# Patient Record
Sex: Female | Born: 1935
Health system: Southern US, Community
[De-identification: ages and names within clinical notes are randomized; demographics above are authoritative.]

## PROBLEM LIST (undated history)

## (undated) DIAGNOSIS — R911 Solitary pulmonary nodule: Secondary | ICD-10-CM

## (undated) DIAGNOSIS — G250 Essential tremor: Secondary | ICD-10-CM

## (undated) DIAGNOSIS — Z8679 Personal history of other diseases of the circulatory system: Secondary | ICD-10-CM

## (undated) DIAGNOSIS — D7589 Other specified diseases of blood and blood-forming organs: Secondary | ICD-10-CM

## (undated) DIAGNOSIS — N183 Chronic kidney disease, stage 3 unspecified: Secondary | ICD-10-CM

## (undated) DIAGNOSIS — I472 Ventricular tachycardia: Secondary | ICD-10-CM

## (undated) DIAGNOSIS — I728 Aneurysm of other specified arteries: Secondary | ICD-10-CM

## (undated) DIAGNOSIS — R42 Dizziness and giddiness: Secondary | ICD-10-CM

## (undated) DIAGNOSIS — A389 Scarlet fever, uncomplicated: Secondary | ICD-10-CM

## (undated) DIAGNOSIS — R002 Palpitations: Secondary | ICD-10-CM

## (undated) DIAGNOSIS — Z8659 Personal history of other mental and behavioral disorders: Secondary | ICD-10-CM

## (undated) DIAGNOSIS — F419 Anxiety disorder, unspecified: Secondary | ICD-10-CM

## (undated) DIAGNOSIS — Z780 Asymptomatic menopausal state: Secondary | ICD-10-CM

## (undated) DIAGNOSIS — R7401 Elevation of levels of liver transaminase levels: Secondary | ICD-10-CM

## (undated) DIAGNOSIS — C439 Malignant melanoma of skin, unspecified: Secondary | ICD-10-CM

## (undated) DIAGNOSIS — L989 Disorder of the skin and subcutaneous tissue, unspecified: Secondary | ICD-10-CM

## (undated) DIAGNOSIS — N952 Postmenopausal atrophic vaginitis: Secondary | ICD-10-CM

## (undated) DIAGNOSIS — R55 Syncope and collapse: Secondary | ICD-10-CM

## (undated) DIAGNOSIS — I4729 Other ventricular tachycardia: Secondary | ICD-10-CM

## (undated) DIAGNOSIS — I10 Essential (primary) hypertension: Secondary | ICD-10-CM

## (undated) DIAGNOSIS — G5710 Meralgia paresthetica, unspecified lower limb: Secondary | ICD-10-CM

## (undated) HISTORY — DX: Syncope and collapse: R55

## (undated) HISTORY — DX: Personal history of other mental and behavioral disorders: Z86.59

## (undated) HISTORY — DX: Scarlet fever, uncomplicated: A38.9

## (undated) HISTORY — DX: Meralgia paresthetica, unspecified lower limb: G57.10

## (undated) HISTORY — DX: Disorder of the skin and subcutaneous tissue, unspecified: L98.9

## (undated) HISTORY — DX: Asymptomatic menopausal state: Z78.0

## (undated) HISTORY — PX: DENTAL SURGERY: SHX609

## (undated) HISTORY — DX: Ventricular tachycardia: I47.2

## (undated) HISTORY — PX: CATARACT EXTRACTION: SUR2

## (undated) HISTORY — DX: Other ventricular tachycardia: I47.29

## (undated) HISTORY — PX: TONSILLECTOMY: SUR1361

## (undated) HISTORY — DX: Personal history of other diseases of the circulatory system: Z86.79

## (undated) HISTORY — DX: Aneurysm of other specified arteries: I72.8

## (undated) HISTORY — DX: Essential (primary) hypertension: I10

## (undated) HISTORY — DX: Palpitations: R00.2

## (undated) HISTORY — DX: Chronic kidney disease, stage 3 unspecified: N18.30

## (undated) HISTORY — DX: Other specified diseases of blood and blood-forming organs: D75.89

## (undated) HISTORY — DX: Elevation of levels of liver transaminase levels: R74.01

## (undated) HISTORY — DX: Postmenopausal atrophic vaginitis: N95.2

## (undated) HISTORY — DX: Solitary pulmonary nodule: R91.1

## (undated) HISTORY — PX: OTHER SURGICAL HISTORY: SHX169

## (undated) HISTORY — PX: SKIN CANCER EXCISION: SHX779

## (undated) HISTORY — DX: Essential tremor: G25.0

## (undated) HISTORY — DX: Dizziness and giddiness: R42

## (undated) HISTORY — DX: Anxiety disorder, unspecified: F41.9

## (undated) HISTORY — DX: Malignant melanoma of skin, unspecified: C43.9

---

## 1998-05-29 ENCOUNTER — Other Ambulatory Visit: Admission: RE | Admit: 1998-05-29 | Discharge: 1998-05-29 | Payer: Self-pay | Admitting: Obstetrics and Gynecology

## 1999-05-31 ENCOUNTER — Other Ambulatory Visit: Admission: RE | Admit: 1999-05-31 | Discharge: 1999-05-31 | Payer: Self-pay | Admitting: Obstetrics and Gynecology

## 1999-12-30 ENCOUNTER — Encounter (INDEPENDENT_AMBULATORY_CARE_PROVIDER_SITE_OTHER): Payer: Self-pay | Admitting: Specialist

## 1999-12-30 ENCOUNTER — Ambulatory Visit (HOSPITAL_COMMUNITY): Admission: RE | Admit: 1999-12-30 | Discharge: 1999-12-30 | Payer: Self-pay | Admitting: Gastroenterology

## 2000-07-27 ENCOUNTER — Other Ambulatory Visit: Admission: RE | Admit: 2000-07-27 | Discharge: 2000-07-27 | Payer: Self-pay | Admitting: Obstetrics and Gynecology

## 2000-08-23 ENCOUNTER — Encounter: Admission: RE | Admit: 2000-08-23 | Discharge: 2000-08-23 | Payer: Self-pay | Admitting: Obstetrics and Gynecology

## 2000-08-23 ENCOUNTER — Encounter: Payer: Self-pay | Admitting: Obstetrics and Gynecology

## 2001-03-23 ENCOUNTER — Encounter: Payer: Self-pay | Admitting: *Deleted

## 2001-03-23 ENCOUNTER — Ambulatory Visit (HOSPITAL_COMMUNITY): Admission: RE | Admit: 2001-03-23 | Discharge: 2001-03-23 | Payer: Self-pay | Admitting: *Deleted

## 2001-08-06 ENCOUNTER — Other Ambulatory Visit: Admission: RE | Admit: 2001-08-06 | Discharge: 2001-08-06 | Payer: Self-pay | Admitting: Obstetrics and Gynecology

## 2002-08-26 ENCOUNTER — Encounter: Admission: RE | Admit: 2002-08-26 | Discharge: 2002-08-26 | Payer: Self-pay | Admitting: Internal Medicine

## 2002-08-26 ENCOUNTER — Encounter: Payer: Self-pay | Admitting: Internal Medicine

## 2002-09-19 ENCOUNTER — Other Ambulatory Visit: Admission: RE | Admit: 2002-09-19 | Discharge: 2002-09-19 | Payer: Self-pay | Admitting: Obstetrics and Gynecology

## 2003-11-28 ENCOUNTER — Other Ambulatory Visit: Admission: RE | Admit: 2003-11-28 | Discharge: 2003-11-28 | Payer: Self-pay | Admitting: Obstetrics and Gynecology

## 2003-12-03 ENCOUNTER — Ambulatory Visit (HOSPITAL_COMMUNITY): Admission: RE | Admit: 2003-12-03 | Discharge: 2003-12-03 | Payer: Self-pay | Admitting: Internal Medicine

## 2005-02-17 ENCOUNTER — Ambulatory Visit (HOSPITAL_COMMUNITY): Admission: RE | Admit: 2005-02-17 | Discharge: 2005-02-17 | Payer: Self-pay | Admitting: Gastroenterology

## 2006-01-03 ENCOUNTER — Ambulatory Visit (HOSPITAL_COMMUNITY): Admission: RE | Admit: 2006-01-03 | Discharge: 2006-01-03 | Payer: Self-pay | Admitting: Internal Medicine

## 2006-03-06 ENCOUNTER — Other Ambulatory Visit: Admission: RE | Admit: 2006-03-06 | Discharge: 2006-03-06 | Payer: Self-pay | Admitting: Obstetrics & Gynecology

## 2007-03-09 ENCOUNTER — Other Ambulatory Visit: Admission: RE | Admit: 2007-03-09 | Discharge: 2007-03-09 | Payer: Self-pay | Admitting: Obstetrics & Gynecology

## 2008-09-23 ENCOUNTER — Ambulatory Visit (HOSPITAL_COMMUNITY): Admission: RE | Admit: 2008-09-23 | Discharge: 2008-09-23 | Payer: Self-pay | Admitting: Obstetrics and Gynecology

## 2009-03-09 ENCOUNTER — Other Ambulatory Visit: Admission: RE | Admit: 2009-03-09 | Discharge: 2009-03-09 | Payer: Self-pay | Admitting: Obstetrics & Gynecology

## 2011-03-08 ENCOUNTER — Other Ambulatory Visit: Payer: Self-pay | Admitting: Internal Medicine

## 2011-03-08 DIAGNOSIS — R911 Solitary pulmonary nodule: Secondary | ICD-10-CM

## 2011-03-11 ENCOUNTER — Other Ambulatory Visit: Payer: Self-pay | Admitting: Internal Medicine

## 2011-03-11 DIAGNOSIS — N281 Cyst of kidney, acquired: Secondary | ICD-10-CM

## 2011-03-11 DIAGNOSIS — R911 Solitary pulmonary nodule: Secondary | ICD-10-CM

## 2011-03-21 ENCOUNTER — Ambulatory Visit
Admission: RE | Admit: 2011-03-21 | Discharge: 2011-03-21 | Disposition: A | Discharge status: Home / Self Care | Payer: Medicare Other | Source: Ambulatory Visit | Attending: Internal Medicine | Admitting: Internal Medicine

## 2011-03-21 DIAGNOSIS — R911 Solitary pulmonary nodule: Secondary | ICD-10-CM

## 2011-03-21 DIAGNOSIS — N281 Cyst of kidney, acquired: Secondary | ICD-10-CM

## 2011-04-01 NOTE — Op Note (Signed)
NAMENETTYE, FLEGAL            ACCOUNT NO.:  [id number]   MEDICAL RECORD NO.:  [id number]          PATIENT TYPE:  AMB   LOCATION:  ENDO                         FACILITY:  [facility]   PHYSICIAN:  Petra Kuba, M.D.    DATE OF BIRTH:  07-04-1929   DATE OF PROCEDURE:  02/17/2005  DATE OF DISCHARGE:                                 OPERATIVE REPORT   PROCEDURE:  Colonoscopy.   INDICATIONS:  Personal history of colon polyps.  Family history of colon  cancer.   Consent was signed after risks, benefits, methods, and options thoroughly  discussed in the office multiple times in the past.   MEDICINES USED:  Demerol 40, Versed 5.   PROCEDURE:  Rectal inspection is pertinent for external hemorrhoids, small.  Digital exam was negative.  The video pediatric adjustable colonoscope was  inserted, despite a tortuous colon with lots of bubbles.  With rolling her  on her back and various abdominal pressures, we were able to be advanced to  the cecum.  Other than some left-sided occasional diverticula, no  abnormalities were seen on insertion.  The cecum was identified by the  appendiceal orifice and the ileocecal valve.  The scope was slowly  withdrawn.  It took a liter of __________ washing for adequate visualization  but on slow withdrawal back to the rectum, other than an occasional left-  sided diverticula, no polyps, tumors, or masses were seen.  Anorectal pull-  through and retroflexion confirmed some small hemorrhoids.  The scope was  straightened and readvanced shortways up the left side of the colon.  Air  was suctioned.  The scope removed.  The patient tolerated the procedure  well.  There was no obvious immediate complication.   ENDOSCOPIC DIAGNOSES:  1.  Internal/external hemorrhoids.  2.  Left-sided occasional diverticula.  3.  Otherwise within normal limits to the cecum.   PLAN:  Yearly rectals and guaiacs per either Dr. Renne Crigler or Dr. Myrlene Broker.  Happy to see back p.r.n.   Otherwise if doing well in five years, consider  repeat screening, which might include a virtual colonoscopy if widely  acceptable, covered by insurance, etc.      MEM/MEDQ  D:  02/17/2005  T:  02/17/2005  Job:  [id number]   cc:   Laqueta Linden, M.D.  [address]  [place]  [place] [address]  Fax: [phone]   Soyla Murphy. Renne Crigler, M.D.  [address]  [place]  [place] [address]  Fax: [phone]

## 2011-05-10 ENCOUNTER — Other Ambulatory Visit: Payer: Self-pay | Admitting: Specialist

## 2011-05-10 DIAGNOSIS — I739 Peripheral vascular disease, unspecified: Secondary | ICD-10-CM

## 2011-05-12 ENCOUNTER — Ambulatory Visit
Admission: RE | Admit: 2011-05-12 | Discharge: 2011-05-12 | Disposition: A | Discharge status: Home / Self Care | Payer: Medicare Other | Source: Ambulatory Visit | Attending: Specialist | Admitting: Specialist

## 2011-05-12 DIAGNOSIS — I739 Peripheral vascular disease, unspecified: Secondary | ICD-10-CM

## 2012-03-26 ENCOUNTER — Other Ambulatory Visit: Payer: Self-pay | Admitting: Nurse Practitioner

## 2012-03-26 DIAGNOSIS — M810 Age-related osteoporosis without current pathological fracture: Secondary | ICD-10-CM

## 2012-03-26 DIAGNOSIS — Z1231 Encounter for screening mammogram for malignant neoplasm of breast: Secondary | ICD-10-CM

## 2012-04-12 ENCOUNTER — Other Ambulatory Visit: Payer: Self-pay | Admitting: Internal Medicine

## 2012-04-12 DIAGNOSIS — R222 Localized swelling, mass and lump, trunk: Secondary | ICD-10-CM

## 2012-04-20 ENCOUNTER — Ambulatory Visit
Admission: RE | Admit: 2012-04-20 | Discharge: 2012-04-20 | Disposition: A | Discharge status: Home / Self Care | Payer: Medicare Other | Source: Ambulatory Visit | Attending: Internal Medicine | Admitting: Internal Medicine

## 2012-04-20 DIAGNOSIS — R222 Localized swelling, mass and lump, trunk: Secondary | ICD-10-CM

## 2012-04-23 ENCOUNTER — Ambulatory Visit (HOSPITAL_COMMUNITY): Payer: Medicare Other | Attending: Nurse Practitioner

## 2012-04-23 ENCOUNTER — Ambulatory Visit (HOSPITAL_COMMUNITY): Payer: Medicare Other

## 2013-03-28 ENCOUNTER — Encounter: Payer: Self-pay | Admitting: *Deleted

## 2013-03-29 ENCOUNTER — Encounter: Payer: Self-pay | Admitting: Nurse Practitioner

## 2013-03-29 ENCOUNTER — Ambulatory Visit (INDEPENDENT_AMBULATORY_CARE_PROVIDER_SITE_OTHER): Payer: MEDICARE | Admitting: Nurse Practitioner

## 2013-03-29 VITALS — BP 122/64 | Ht 62.25 in | Wt 122.0 lb

## 2013-03-29 DIAGNOSIS — L940 Localized scleroderma [morphea]: Secondary | ICD-10-CM

## 2013-03-29 DIAGNOSIS — N952 Postmenopausal atrophic vaginitis: Secondary | ICD-10-CM

## 2013-03-29 DIAGNOSIS — Z01419 Encounter for gynecological examination (general) (routine) without abnormal findings: Secondary | ICD-10-CM

## 2013-03-29 DIAGNOSIS — N904 Leukoplakia of vulva: Secondary | ICD-10-CM

## 2013-03-29 MED ORDER — CLOBETASOL PROPIONATE 0.05 % EX CREA: TOPICAL_CREAM | CUTANEOUS | Status: DC | PRN

## 2013-03-29 MED ORDER — ESTRADIOL 0.1 MG/GM VA CREA: 1.0000 g | TOPICAL_CREAM | VAGINAL | Status: DC

## 2013-03-29 NOTE — Patient Instructions (Signed)

## 2013-03-29 NOTE — Progress Notes (Signed)
79 y.o. G59P4 Married Caucasian Fe here for annual exam.  Feels well. Needs refill on estrace cream and clobetasol for LSA. Only occ flares  Husband with several falls and will be having home PT in the near future.   The wedding shower for her daughter who lives in [place]  last July was a big hit.  Patient's last menstrual period was 11/14/1978.          Sexually active: no  The current method of family planning is abstinence.    Exercising: yes  walking, gardening & swimming Smoker:  no  Health Maintenance: Pap:  03-23-11 neg MMG:  09-23-08 will schedule Colonoscopy:  01-2011=  3 polyps recheck in 5 years TDaP:  1/05 Labs: done by Dr. Renne Crigler   reports that she has never smoked. She does not have any smokeless tobacco history on file. She reports that she does not drink alcohol or use illicit drugs.  Past Medical History  Diagnosis Date  . Syncope   . Palpitations     occasional  . Dizziness   . Meralgia paresthetica     History of  . History of anxiety   . Tinnitus     Chronic  . History of rheumatic fever   . Macrocytosis     History of chronic macrocytosis  . Allergic rhinitis   . Osteoporosis   . Post-menopausal   . Sclerosis of the skin     lichen  . Atrophic vaginitis     Past Surgical History  Procedure Laterality Date  . Colon polyps      Current Outpatient Prescriptions  Medication Sig Dispense Refill  . Cholecalciferol (VITAMIN D) 2000 UNITS CAPS Take by mouth daily.      . clobetasol cream (TEMOVATE) 0.05 % Apply topically as needed.       . Coenzyme Q10 (CO Q 10 PO) Take by mouth every other day.      . estradiol (ESTRACE) 0.1 MG/GM vaginal cream Place vaginally 2 (two) times a week.       . Flaxseed, Linseed, (FLAXSEED OIL) 1000 MG CAPS Take by mouth daily.      . magnesium 30 MG tablet Take 30 mg by mouth daily.      . Multiple Vitamins-Minerals (OCUVITE PO) Take by mouth daily.      . Nutritional Supplements (JUICE PLUS FIBRE PO) Take by mouth daily.       . Omega-3 Fatty Acids (FISH OIL PO) Take by mouth 3 (three) times a week.       No current facility-administered medications for this visit.    Family History  Problem Relation Age of Onset  . Heart attack Father     ROS:  Pertinent items are noted in HPI.  Otherwise, a comprehensive ROS was negative.  Exam:   BP 122/64  Ht 5' 2.25" (1.581 m)  Wt 122 lb (55.339 kg)  BMI 22.14 kg/m2  LMP 11/14/1978 Height: 5' 2.25" (158.1 cm)  Ht Readings from Last 3 Encounters:  03/29/13 5' 2.25" (1.581 m)    General appearance: alert, cooperative and appears stated age Head: Normocephalic, without obvious abnormality, atraumatic Neck: no adenopathy, supple, symmetrical, trachea midline and thyroid normal to inspection and palpation Lungs: clear to auscultation bilaterally Breasts: normal appearance, no masses or tenderness Heart: regular rate and rhythm Abdomen: soft, non-tender; no masses,  no organomegaly Extremities: extremities normal, atraumatic, no cyanosis or edema Skin: Skin color, texture, turgor normal. No rashes or lesions Lymph nodes: Cervical,  supraclavicular, and axillary nodes normal. No abnormal inguinal nodes palpated Neurologic: Grossly normal   Pelvic: External genitalia:  no flare of LSA lesions              Urethra:  atrophic appearing urethra with no masses, tenderness or lesions              Bartholin's and Skene's: normal                 Vagina: atrophic appearing vagina with pale color and no discharge, no lesions              Cervix: anteverted              Pap taken: no Bimanual Exam:  Uterus:  normal size, contour, position, consistency, mobility, non-tender              Adnexa: no mass, fullness, tenderness               Rectovaginal: Confirms               Anus:  normal sphincter tone, no lesions  A:  Well Woman with normal exam  Postmenopausal   Atrophic vaginitis  History of LSA  History of colon polyps - declines OC light cards here took test with  PCP  Osteopenia managed by PCP    P:   Pap smear as per guidelines   Mammogram - patient will schedule  Refill on Estrace and clobetasol  Caution with outdoor activities and balance issues    return annually or prn  An After Visit Summary was printed and given to the patient.

## 2013-04-03 NOTE — Progress Notes (Signed)
Encounter reviewed by Dr. Brook Silva.  

## 2013-10-17 ENCOUNTER — Telehealth: Payer: Self-pay | Admitting: Nurse Practitioner

## 2013-10-17 NOTE — Telephone Encounter (Signed)
Patient has a hx of osteopenia which is handled by pcp per last note.  Message left to return call to [place] at [phone].

## 2013-10-17 NOTE — Telephone Encounter (Signed)
Patient had a recent bone density done at [facility]. Patient has some questions about medications. Patient asked to speak with Shirlyn Goltz, NP directly but is aware a triage nurse will call her first.

## 2013-10-17 NOTE — Telephone Encounter (Signed)
Patient returned call, states that [facility] was to send over Dexa. That is what patient would like to to talk to Lauro Franklin, FNP about. Advised that I could not tell her when Clayborne Dana would be able to call but that I would send her the message.

## 2013-10-22 NOTE — Telephone Encounter (Signed)
Called [facility] states she is not a patient there) and they states they will have nurse call me back for request for Dexa.

## 2013-10-22 NOTE — Telephone Encounter (Signed)
DEXA and chart to your desk. Thanks, Patty!

## 2013-10-22 NOTE — Telephone Encounter (Signed)
Patient was called and reviewed BMD results from [facility].  After looking at [facility] from [facility] and comparison there are some changes that may be related to different machines.  Her spine has not been used for comparison at [facility] secondary to scoliosis.  At this last BMD her T score falls in the Osteoporosis range at the hip sites.  She has been recommended to start on Fosamax but is reluctant due to potential GI side effects.  She would rather wait and increase exercise and calcium with Vit D.  She is also seeing a chiropractor for her back.  She prefers to not take med's.  This of course is her choice.  She will pick up a copy of BMD tomorrow to review with chiropractor.

## 2014-04-03 ENCOUNTER — Encounter: Payer: Self-pay | Admitting: Nurse Practitioner

## 2014-04-03 ENCOUNTER — Ambulatory Visit (INDEPENDENT_AMBULATORY_CARE_PROVIDER_SITE_OTHER): Payer: Medicare HMO | Admitting: Nurse Practitioner

## 2014-04-03 VITALS — BP 132/84 | HR 80 | Ht 63.0 in | Wt 129.0 lb

## 2014-04-03 DIAGNOSIS — L940 Localized scleroderma [morphea]: Secondary | ICD-10-CM

## 2014-04-03 DIAGNOSIS — N952 Postmenopausal atrophic vaginitis: Secondary | ICD-10-CM

## 2014-04-03 DIAGNOSIS — Z01419 Encounter for gynecological examination (general) (routine) without abnormal findings: Secondary | ICD-10-CM

## 2014-04-03 DIAGNOSIS — Z0000 Encounter for general adult medical examination without abnormal findings: Secondary | ICD-10-CM

## 2014-04-03 DIAGNOSIS — N904 Leukoplakia of vulva: Secondary | ICD-10-CM

## 2014-04-03 LAB — POCT URINALYSIS DIPSTICK
Bilirubin, UA: NEGATIVE
Blood, UA: NEGATIVE
Glucose, UA: NEGATIVE
Ketones, UA: NEGATIVE
Leukocytes, UA: NEGATIVE
Nitrite, UA: NEGATIVE
Protein, UA: NEGATIVE
Urobilinogen, UA: NEGATIVE
pH, UA: 7

## 2014-04-03 MED ORDER — CLOBETASOL PROPIONATE 0.05 % EX CREA: TOPICAL_CREAM | CUTANEOUS | Status: DC | PRN

## 2014-04-03 MED ORDER — ESTRADIOL 0.1 MG/GM VA CREA: 1.0000 g | TOPICAL_CREAM | VAGINAL | Status: DC

## 2014-04-03 NOTE — Progress Notes (Signed)
Patient ID: Katie Rubio, female   DOB: 1929-02-13, 80 y.o.   MRN: [id number] 80 y.o. G79P3 Married Caucasian Fe here for annual exam.  daughter is doing well.  Husband still with some ortho problems and had to do PT.  He has now stopped drinking.  She does have muscle spasm in both legs at night occasionally.  Thinks she has current flare of LSA - some discomfort after BM and wiping at the peri-anal area.  Rare use of Clobetasol.  Rare use on Estrace vaginal cream.  Patient's last menstrual period was 11/14/1978.          Sexually active: no  The current method of family planning is abstinence and post menopausal status.    Exercising: yes  pt walks at home and gardens Smoker:  no  Health Maintenance: Pap: 03-23-11 neg  MMG: 09-23-08 will schedule  Colonoscopy: 01-2011= 3 polyps recheck in 5 years  TDaP: 1/05  Labs:  Dr. Shelia Media  Urine:  Negative     reports that she has never smoked. She does not have any smokeless tobacco history on file. She reports that she does not drink alcohol or use illicit drugs.  Past Medical History  Diagnosis Date  . Syncope   . Palpitations     occasional  . Dizziness   . Meralgia paresthetica     History of  . History of anxiety   . Tinnitus     Chronic  . History of rheumatic fever   . Macrocytosis     History of chronic macrocytosis  . Allergic rhinitis   . Osteoporosis   . Post-menopausal   . Sclerosis of the skin     lichen  . Atrophic vaginitis     Past Surgical History  Procedure Laterality Date  . Colon polyps      Current Outpatient Prescriptions  Medication Sig Dispense Refill  . Cholecalciferol (VITAMIN D) 2000 UNITS CAPS Take by mouth daily.      . clobetasol cream (TEMOVATE) 0.05 % Apply topically as needed.  30 g  3  . Coenzyme Q10 (CO Q 10 PO) Take by mouth every other day.      . estradiol (ESTRACE) 0.1 MG/GM vaginal cream Place 1.06 Applicatorfuls vaginally 2 (two) times a week.  42.5 g  3  . Flaxseed, Linseed,  (FLAXSEED OIL) 1000 MG CAPS Take by mouth daily.      . magnesium 30 MG tablet Take 30 mg by mouth daily.      . Multiple Vitamins-Minerals (OCUVITE PO) Take by mouth daily.      . Nutritional Supplements (JUICE PLUS FIBRE PO) Take by mouth daily.      . Omega-3 Fatty Acids (FISH OIL PO) Take by mouth 3 (three) times a week.       No current facility-administered medications for this visit.    Family History  Problem Relation Age of Onset  . Heart attack Father   . Cancer - Colon Mother     colon    ROS:  Pertinent items are noted in HPI.  Otherwise, a comprehensive ROS was negative.  Exam:   BP 132/84  Pulse 80  Ht 5\' 3"  (1.6 m)  Wt 129 lb (58.514 kg)  BMI 22.86 kg/m2  LMP 11/14/1978 Height: 5\' 3"  (160 cm)  Ht Readings from Last 3 Encounters:  04/03/14 5\' 3"  (1.6 m)  03/29/13 5' 2.25" (1.581 m)    General appearance: alert, cooperative and appears stated age Head: Normocephalic,  without obvious abnormality, atraumatic Neck: no adenopathy, supple, symmetrical, trachea midline and thyroid normal to inspection and palpation Lungs: clear to auscultation bilaterally Breasts: normal appearance, no masses or tenderness Heart: regular rate and rhythm Abdomen: soft, non-tender; no masses,  no organomegaly Extremities: extremities normal, atraumatic, no cyanosis or edema Skin: Skin color, texture, turgor normal. No rashes or lesions Lymph nodes: Cervical, supraclavicular, and axillary nodes normal. No abnormal inguinal nodes palpated Neurologic: Grossly normal   Pelvic: External genitalia:  Lesions consistent with LSA              Urethra:  normal appearing urethra with no masses, tenderness or lesions              Bartholin's and Skene's: normal                 Vagina: normal appearing vagina with normal color and discharge, no lesions              Cervix: anteverted              Pap taken: no Bimanual Exam:  Uterus:  normal size, contour, position, consistency, mobility,  non-tender              Adnexa: no mass, fullness, tenderness               Rectovaginal: Confirms               Anus:  normal sphincter tone, no lesions  A:  Well Woman with normal exam  Postmenopausal  History of LSA with flare  P:   Reviewed health and wellness pertinent to exam  Pap smear taken today  Mammogram is due now and we will schedule for her  Refill on Clobetasol and will use now BID for a week or so to calm current flare  Refill on Estrace to use pea size amount to introitus prn, cautioned with risk of DVT, CVA, etc.  Counseled on breast self exam, mammography screening, use and side effects of HRT, adequate intake of calcium and vitamin D, diet and exercise return annually or prn  An After Visit Summary was printed and given to the patient.

## 2014-04-03 NOTE — Patient Instructions (Signed)

## 2014-04-11 NOTE — Progress Notes (Signed)
Encounter reviewed by Dr. Ahsha Hinsley Silva.  

## 2014-09-15 ENCOUNTER — Encounter: Payer: Self-pay | Admitting: Nurse Practitioner

## 2015-04-06 ENCOUNTER — Ambulatory Visit (INDEPENDENT_AMBULATORY_CARE_PROVIDER_SITE_OTHER): Payer: Medicare HMO | Admitting: Nurse Practitioner

## 2015-04-06 ENCOUNTER — Encounter: Payer: Self-pay | Admitting: Nurse Practitioner

## 2015-04-06 VITALS — BP 122/80 | HR 72 | Ht 63.0 in | Wt 131.0 lb

## 2015-04-06 DIAGNOSIS — Z0000 Encounter for general adult medical examination without abnormal findings: Secondary | ICD-10-CM

## 2015-04-06 DIAGNOSIS — N952 Postmenopausal atrophic vaginitis: Secondary | ICD-10-CM

## 2015-04-06 DIAGNOSIS — E559 Vitamin D deficiency, unspecified: Secondary | ICD-10-CM | POA: Diagnosis not present

## 2015-04-06 DIAGNOSIS — N904 Leukoplakia of vulva: Secondary | ICD-10-CM

## 2015-04-06 DIAGNOSIS — Z01419 Encounter for gynecological examination (general) (routine) without abnormal findings: Secondary | ICD-10-CM

## 2015-04-06 LAB — POCT URINALYSIS DIPSTICK
Bilirubin, UA: NEGATIVE
Blood, UA: NEGATIVE
GLUCOSE UA: NEGATIVE
Ketones, UA: NEGATIVE
LEUKOCYTES UA: NEGATIVE
NITRITE UA: NEGATIVE
PH UA: 5
Protein, UA: NEGATIVE
UROBILINOGEN UA: NEGATIVE

## 2015-04-06 MED ORDER — CLOBETASOL PROPIONATE 0.05 % EX CREA: TOPICAL_CREAM | CUTANEOUS | Status: DC | PRN

## 2015-04-06 MED ORDER — ESTRADIOL 0.1 MG/GM VA CREA: 1.0000 g | TOPICAL_CREAM | VAGINAL | Status: DC

## 2015-04-06 NOTE — Patient Instructions (Signed)

## 2015-04-06 NOTE — Progress Notes (Signed)
Patient ID: Katie Rubio, female   DOB: February 15, 1929, 81 y.o.   MRN: [id number] 81 y.o. G51P3 Married  Caucasian Fe here for annual exam.  New grandson 50 months old.  She has a list of other medical problems related to back knees, husbands back problems.  Just need to talk about al this.  She continues to work outside if anything needs to be done since husband not able to do anything.  Patient's last menstrual period was 11/14/1978.  Pt is postmenopausal.      Sexually active: No.  The current method of family planning is none.    Exercising: Yes.    walking, gardening and pool during the summer Smoker:  no  Health Maintenance: Pap:  03/23/11, negative MMG:  09/24/08, Bi-Rads 1:  Negative Colonoscopy:  01/2011, 3 polyps, recall in 5 years BMD:   09/17/13, T Score -1.3 S/-2.5 R/-2.8 L TDaP:  11/2003 Labs:  Dr. Shelia Media 09/2014  Urine:  negative      reports that she has never smoked. She does not have any smokeless tobacco history on file. She reports that she does not drink alcohol or use illicit drugs.  Past Medical History  Diagnosis Date  . Syncope   . Palpitations     occasional  . Dizziness   . Meralgia paresthetica     History of  . History of anxiety   . Tinnitus     Chronic  . History of rheumatic fever   . Macrocytosis     History of chronic macrocytosis  . Allergic rhinitis   . Osteoporosis   . Post-menopausal   . Sclerosis of the skin     lichen  . Atrophic vaginitis     Past Surgical History  Procedure Laterality Date  . Colon polyps      Current Outpatient Prescriptions  Medication Sig Dispense Refill  . Cholecalciferol (VITAMIN D) 2000 UNITS CAPS Take by mouth daily.    . clobetasol cream (TEMOVATE) 0.05 % Apply topically as needed. 30 g 3  . Coenzyme Q10 (CO Q 10 PO) Take by mouth every other day.    . estradiol (ESTRACE) 0.1 MG/GM vaginal cream Place 6.25 Applicatorfuls vaginally 2 (two) times a week. 42.5 g 3  . Flaxseed, Linseed, (FLAXSEED OIL) 1000 MG  CAPS Take by mouth daily.    . magnesium 30 MG tablet Take 30 mg by mouth daily.    . Multiple Vitamins-Minerals (OCUVITE PO) Take by mouth daily.    . Nutritional Supplements (JUICE PLUS FIBRE PO) Take by mouth daily.    . Omega-3 Fatty Acids (FISH OIL PO) Take by mouth 3 (three) times a week.     No current facility-administered medications for this visit.    Family History  Problem Relation Age of Onset  . Heart attack Father   . Cancer - Colon Mother     colon    ROS:  Pertinent items are noted in HPI.  Otherwise, a comprehensive ROS was negative.  Exam:   BP 122/80 mmHg  Pulse 72  Ht 5\' 3"  (1.6 m)  Wt 131 lb (59.421 kg)  BMI 23.21 kg/m2  LMP 11/14/1978 Height: 5\' 3"  (160 cm) Ht Readings from Last 3 Encounters:  04/06/15 5\' 3"  (1.6 m)  04/03/14 5\' 3"  (1.6 m)  03/29/13 5' 2.25" (1.581 m)    General appearance: alert, cooperative and appears stated age Head: Normocephalic, without obvious abnormality, atraumatic Neck: no adenopathy, supple, symmetrical, trachea midline and thyroid normal to  inspection and palpation Lungs: clear to auscultation bilaterally Breasts: normal appearance, no masses or tenderness Heart: regular rate and rhythm Abdomen: soft, non-tender; no masses,  no organomegaly Extremities: extremities normal, atraumatic, no cyanosis or edema Skin: Skin color, texture, turgor normal. No rashes or lesions Lymph nodes: Cervical, supraclavicular, and axillary nodes normal. No abnormal inguinal nodes palpated Neurologic: Grossly normal   Pelvic: External genitalia:  Lesions of LSA on vulva              Urethra:  normal appearing urethra with no masses, tenderness or lesions              Bartholin's and Skene's: normal                 Vagina: very atrophic appearing vagina with normal color and discharge, no lesions              Cervix: anteverted              Pap taken: Yes.   Bimanual Exam:  Uterus:  normal size, contour, position, consistency, mobility,  non-tender              Adnexa: no mass, fullness, tenderness               Rectovaginal: Confirms               Anus:  normal sphincter tone, no lesions  Chaperone present:  yes   A:  Well Woman with normal exam  Postmenopausal History of LSA without current flare  Atrophic vaginitis  History of Vit D deficeincy   P:   Reviewed health and wellness pertinent to exam  Pap smear as above  Mammogram is due but she declines another one  She is given refill on clobetasol and estrogen cream to use to treat LSA - she uses either of these very rarely  Counseled on DVT, CVA, cancer, etc  Check Vit D and follow  Counseled on breast self exam, mammography screening, adequate intake of calcium and vitamin D, diet and exercise, Kegel's exercises return annually or prn  An After Visit Summary was printed and given to the patient.

## 2015-04-07 LAB — VITAMIN D 25 HYDROXY (VIT D DEFICIENCY, FRACTURES): Vit D, 25-Hydroxy: 30 ng/mL (ref 30–100)

## 2015-04-08 LAB — IPS PAP SMEAR ONLY

## 2015-04-12 NOTE — Progress Notes (Signed)
Encounter reviewed by Dr. Brook Silva.  

## 2015-07-06 ENCOUNTER — Other Ambulatory Visit (INDEPENDENT_AMBULATORY_CARE_PROVIDER_SITE_OTHER): Payer: Medicare HMO

## 2015-07-06 ENCOUNTER — Telehealth: Payer: Self-pay

## 2015-07-06 ENCOUNTER — Other Ambulatory Visit: Payer: Medicare HMO

## 2015-07-06 DIAGNOSIS — E559 Vitamin D deficiency, unspecified: Secondary | ICD-10-CM

## 2015-07-06 DIAGNOSIS — N39498 Other specified urinary incontinence: Secondary | ICD-10-CM

## 2015-07-06 NOTE — Telephone Encounter (Signed)
Yes thank you very much for sending the referral.

## 2015-07-06 NOTE — Telephone Encounter (Signed)
Spoke with patient while in office this morning. Patient states that she has spoken with Milford Cage, FNP regarding "taking classes for not being able to hold my bladder at night to get to the bathroom." Patient is interested in pursuing this at this time. Advised referral will need to be placed for patient to be seen with pelvic PT by Milford Cage, FNP. Advised I will let Milford Cage, FNP know so this may be done. Advised the referral information and OV notes will be sent and appointment will be made. Advised she will hear from our referrals coordinator Becky or from [place] office directly to set up appointment. Patient is agreeable.  Milford Cage, FNP okay to send referral for pelvic PT to [facility] at this time?

## 2015-07-07 LAB — VITAMIN D 25 HYDROXY (VIT D DEFICIENCY, FRACTURES): VIT D 25 HYDROXY: 32 ng/mL (ref 30–100)

## 2015-07-07 NOTE — Telephone Encounter (Signed)
Referral placed in [facility] for patient to see [facility] for pelvic PT.   CC: Katie Rubio for referral coordination  Routing to provider for final review. Patient agreeable to disposition. Will close encounter.

## 2015-07-08 ENCOUNTER — Other Ambulatory Visit: Payer: Medicare HMO

## 2015-07-09 ENCOUNTER — Telehealth: Payer: Self-pay | Admitting: *Deleted

## 2015-07-09 NOTE — Telephone Encounter (Signed)
I have attempted to contact this patient by phone with the following results: message left to return my call with female at home number.  [phone] (Home)

## 2015-07-09 NOTE — Telephone Encounter (Signed)
-----   Message from Kem Boroughs, [place] sent at 07/07/2015  8:40 AM EDT ----- Let pt. Know that her Vit D is some better from 30 - 32.  She was on OTC Vit D 2000 IU daily and we added an extra dose every other day - that only bought her results up to 32.  Now lets have her to increase to 4000 IU daily especially during the winter months.  Then will recheck at [facility].

## 2015-07-09 NOTE — Telephone Encounter (Signed)
Pt notified in result note.  Closing encounter. 

## 2015-08-20 DIAGNOSIS — R351 Nocturia: Secondary | ICD-10-CM | POA: Diagnosis not present

## 2015-08-20 DIAGNOSIS — R32 Unspecified urinary incontinence: Secondary | ICD-10-CM | POA: Diagnosis not present

## 2015-08-20 DIAGNOSIS — R278 Other lack of coordination: Secondary | ICD-10-CM | POA: Diagnosis not present

## 2015-08-20 DIAGNOSIS — N3944 Nocturnal enuresis: Secondary | ICD-10-CM | POA: Diagnosis not present

## 2015-08-20 DIAGNOSIS — M6281 Muscle weakness (generalized): Secondary | ICD-10-CM | POA: Diagnosis not present

## 2015-09-03 DIAGNOSIS — M6281 Muscle weakness (generalized): Secondary | ICD-10-CM | POA: Diagnosis not present

## 2015-09-03 DIAGNOSIS — R32 Unspecified urinary incontinence: Secondary | ICD-10-CM | POA: Diagnosis not present

## 2015-09-03 DIAGNOSIS — N3944 Nocturnal enuresis: Secondary | ICD-10-CM | POA: Diagnosis not present

## 2015-09-03 DIAGNOSIS — R351 Nocturia: Secondary | ICD-10-CM | POA: Diagnosis not present

## 2015-09-03 DIAGNOSIS — R278 Other lack of coordination: Secondary | ICD-10-CM | POA: Diagnosis not present

## 2015-09-08 DIAGNOSIS — R32 Unspecified urinary incontinence: Secondary | ICD-10-CM | POA: Diagnosis not present

## 2015-09-08 DIAGNOSIS — M6281 Muscle weakness (generalized): Secondary | ICD-10-CM | POA: Diagnosis not present

## 2015-09-08 DIAGNOSIS — R278 Other lack of coordination: Secondary | ICD-10-CM | POA: Diagnosis not present

## 2015-09-08 DIAGNOSIS — R351 Nocturia: Secondary | ICD-10-CM | POA: Diagnosis not present

## 2015-09-08 DIAGNOSIS — N3944 Nocturnal enuresis: Secondary | ICD-10-CM | POA: Diagnosis not present

## 2015-09-22 DIAGNOSIS — L814 Other melanin hyperpigmentation: Secondary | ICD-10-CM | POA: Diagnosis not present

## 2015-09-22 DIAGNOSIS — L821 Other seborrheic keratosis: Secondary | ICD-10-CM | POA: Diagnosis not present

## 2015-09-22 DIAGNOSIS — L570 Actinic keratosis: Secondary | ICD-10-CM | POA: Diagnosis not present

## 2015-09-22 DIAGNOSIS — D1801 Hemangioma of skin and subcutaneous tissue: Secondary | ICD-10-CM | POA: Diagnosis not present

## 2015-09-22 DIAGNOSIS — D225 Melanocytic nevi of trunk: Secondary | ICD-10-CM | POA: Diagnosis not present

## 2015-10-05 DIAGNOSIS — H5712 Ocular pain, left eye: Secondary | ICD-10-CM | POA: Diagnosis not present

## 2015-10-05 DIAGNOSIS — M25522 Pain in left elbow: Secondary | ICD-10-CM | POA: Diagnosis not present

## 2015-10-13 DIAGNOSIS — R2689 Other abnormalities of gait and mobility: Secondary | ICD-10-CM | POA: Diagnosis not present

## 2015-10-16 DIAGNOSIS — Z961 Presence of intraocular lens: Secondary | ICD-10-CM | POA: Diagnosis not present

## 2015-10-16 DIAGNOSIS — R233 Spontaneous ecchymoses: Secondary | ICD-10-CM | POA: Diagnosis not present

## 2015-10-27 DIAGNOSIS — Z9181 History of falling: Secondary | ICD-10-CM | POA: Diagnosis not present

## 2015-10-27 DIAGNOSIS — M6281 Muscle weakness (generalized): Secondary | ICD-10-CM | POA: Diagnosis not present

## 2015-10-30 DIAGNOSIS — M6281 Muscle weakness (generalized): Secondary | ICD-10-CM | POA: Diagnosis not present

## 2015-10-30 DIAGNOSIS — Z9181 History of falling: Secondary | ICD-10-CM | POA: Diagnosis not present

## 2015-11-03 DIAGNOSIS — Z9181 History of falling: Secondary | ICD-10-CM | POA: Diagnosis not present

## 2015-11-03 DIAGNOSIS — M6281 Muscle weakness (generalized): Secondary | ICD-10-CM | POA: Diagnosis not present

## 2015-11-06 DIAGNOSIS — M6281 Muscle weakness (generalized): Secondary | ICD-10-CM | POA: Diagnosis not present

## 2015-11-06 DIAGNOSIS — Z9181 History of falling: Secondary | ICD-10-CM | POA: Diagnosis not present

## 2015-11-10 DIAGNOSIS — Z9181 History of falling: Secondary | ICD-10-CM | POA: Diagnosis not present

## 2015-11-10 DIAGNOSIS — M6281 Muscle weakness (generalized): Secondary | ICD-10-CM | POA: Diagnosis not present

## 2015-11-12 DIAGNOSIS — M6281 Muscle weakness (generalized): Secondary | ICD-10-CM | POA: Diagnosis not present

## 2015-11-12 DIAGNOSIS — Z9181 History of falling: Secondary | ICD-10-CM | POA: Diagnosis not present

## 2015-11-17 DIAGNOSIS — Z9181 History of falling: Secondary | ICD-10-CM | POA: Diagnosis not present

## 2015-11-17 DIAGNOSIS — M6281 Muscle weakness (generalized): Secondary | ICD-10-CM | POA: Diagnosis not present

## 2015-11-19 DIAGNOSIS — M6281 Muscle weakness (generalized): Secondary | ICD-10-CM | POA: Diagnosis not present

## 2015-11-19 DIAGNOSIS — Z9181 History of falling: Secondary | ICD-10-CM | POA: Diagnosis not present

## 2015-11-26 DIAGNOSIS — M6281 Muscle weakness (generalized): Secondary | ICD-10-CM | POA: Diagnosis not present

## 2015-11-26 DIAGNOSIS — Z9181 History of falling: Secondary | ICD-10-CM | POA: Diagnosis not present

## 2015-12-16 DIAGNOSIS — E559 Vitamin D deficiency, unspecified: Secondary | ICD-10-CM | POA: Diagnosis not present

## 2015-12-16 DIAGNOSIS — M810 Age-related osteoporosis without current pathological fracture: Secondary | ICD-10-CM | POA: Diagnosis not present

## 2015-12-16 DIAGNOSIS — Z13220 Encounter for screening for lipoid disorders: Secondary | ICD-10-CM | POA: Diagnosis not present

## 2015-12-16 DIAGNOSIS — Z0000 Encounter for general adult medical examination without abnormal findings: Secondary | ICD-10-CM | POA: Diagnosis not present

## 2015-12-16 DIAGNOSIS — H6121 Impacted cerumen, right ear: Secondary | ICD-10-CM | POA: Diagnosis not present

## 2015-12-16 DIAGNOSIS — Z7982 Long term (current) use of aspirin: Secondary | ICD-10-CM | POA: Diagnosis not present

## 2015-12-23 DIAGNOSIS — Z0000 Encounter for general adult medical examination without abnormal findings: Secondary | ICD-10-CM | POA: Diagnosis not present

## 2015-12-23 DIAGNOSIS — K5900 Constipation, unspecified: Secondary | ICD-10-CM | POA: Diagnosis not present

## 2015-12-23 DIAGNOSIS — Z7982 Long term (current) use of aspirin: Secondary | ICD-10-CM | POA: Diagnosis not present

## 2015-12-23 DIAGNOSIS — M4184 Other forms of scoliosis, thoracic region: Secondary | ICD-10-CM | POA: Diagnosis not present

## 2015-12-23 DIAGNOSIS — I728 Aneurysm of other specified arteries: Secondary | ICD-10-CM | POA: Diagnosis not present

## 2015-12-23 DIAGNOSIS — M546 Pain in thoracic spine: Secondary | ICD-10-CM | POA: Diagnosis not present

## 2015-12-23 DIAGNOSIS — M810 Age-related osteoporosis without current pathological fracture: Secondary | ICD-10-CM | POA: Diagnosis not present

## 2015-12-30 DIAGNOSIS — J449 Chronic obstructive pulmonary disease, unspecified: Secondary | ICD-10-CM | POA: Diagnosis not present

## 2015-12-30 DIAGNOSIS — M546 Pain in thoracic spine: Secondary | ICD-10-CM | POA: Diagnosis not present

## 2016-02-18 DIAGNOSIS — M2042 Other hammer toe(s) (acquired), left foot: Secondary | ICD-10-CM | POA: Diagnosis not present

## 2016-02-18 DIAGNOSIS — D2372 Other benign neoplasm of skin of left lower limb, including hip: Secondary | ICD-10-CM | POA: Diagnosis not present

## 2016-04-06 ENCOUNTER — Ambulatory Visit: Payer: Medicare HMO | Admitting: Nurse Practitioner

## 2016-04-08 ENCOUNTER — Ambulatory Visit: Payer: Medicare HMO | Admitting: Nurse Practitioner

## 2016-04-12 ENCOUNTER — Ambulatory Visit (INDEPENDENT_AMBULATORY_CARE_PROVIDER_SITE_OTHER): Payer: Medicare HMO | Admitting: Nurse Practitioner

## 2016-04-12 ENCOUNTER — Encounter: Payer: Self-pay | Admitting: Nurse Practitioner

## 2016-04-12 VITALS — BP 124/86 | HR 88 | Ht 62.5 in | Wt 130.0 lb

## 2016-04-12 DIAGNOSIS — E559 Vitamin D deficiency, unspecified: Secondary | ICD-10-CM

## 2016-04-12 DIAGNOSIS — Z0000 Encounter for general adult medical examination without abnormal findings: Secondary | ICD-10-CM | POA: Diagnosis not present

## 2016-04-12 DIAGNOSIS — N393 Stress incontinence (female) (male): Secondary | ICD-10-CM | POA: Diagnosis not present

## 2016-04-12 DIAGNOSIS — Z01419 Encounter for gynecological examination (general) (routine) without abnormal findings: Secondary | ICD-10-CM

## 2016-04-12 NOTE — Patient Instructions (Signed)

## 2016-04-12 NOTE — Progress Notes (Signed)
Patient ID: Katie Rubio, female   DOB: 09-27-1929, 82 y.o.   MRN: [id number]  82 y.o. [id number] Married  Caucasian Fe here for annual exam.  [place] in November and fell face down.  Went to Urgent Care and X ray head and shoulder.  Had bruising on her face.  She would like to go back to Uro PT as she is having symptoms again of stress incontinence. Husband also has had falls and hs seen Neurosurgeon.  Patient's last menstrual period was 11/14/1978 (approximate).          Sexually active: No.  The current method of family planning is abstinence.    Exercising: Yes.    walking and working in garden Smoker:  no  Health Maintenance: Pap:04/06/15, Negative  MMG: 09/24/08, Bi-Rads 1: Negative Colonoscopy: 01/2011, 3 polyps, recall in 5 years due to family history of colon cancer (has received recall letter, but has not scheduled) BMD: 09/17/13, T Score -1.3 Spine / -2.5 Right Femur Neck / -2.8 Left Femur Neck TDaP:2015 Shingles: Never Pneumonia: Never Hep C and HIV: Not indicated due to age Labs: Dr. Shelia Media, had Vit D check with Dr. Shelia Media in 12/2015   reports that she has never smoked. She has never used smokeless tobacco. She reports that she does not drink alcohol or use illicit drugs.  Past Medical History  Diagnosis Date  . Syncope   . Palpitations     occasional  . Dizziness   . Meralgia paresthetica     History of  . History of anxiety   . Tinnitus     Chronic  . History of rheumatic fever   . Macrocytosis     History of chronic macrocytosis  . Allergic rhinitis   . Osteoporosis   . Post-menopausal   . Sclerosis of the skin     lichen  . Atrophic vaginitis     Past Surgical History  Procedure Laterality Date  . Colon polyps      Current Outpatient Prescriptions  Medication Sig Dispense Refill  . Cholecalciferol (VITAMIN D) 2000 UNITS CAPS Take by mouth daily.    . clobetasol cream (TEMOVATE) 0.05 % Apply topically as needed. 30 g 3  . Coenzyme Q10 (CO Q 10 PO)  Take by mouth every other day.    . estradiol (ESTRACE) 0.1 MG/GM vaginal cream Place [id number] Applicatorfuls vaginally 2 (two) times a week. 42.5 g 3  . Flaxseed, Linseed, (FLAXSEED OIL) 1000 MG CAPS Take by mouth daily.    . magnesium 30 MG tablet Take 30 mg by mouth daily.    . Multiple Vitamins-Minerals (OCUVITE PO) Take by mouth daily.    . Nutritional Supplements (JUICE PLUS FIBRE PO) Take by mouth daily.    . Omega-3 Fatty Acids (FISH OIL PO) Take by mouth 3 (three) times a week.     No current facility-administered medications for this visit.    Family History  Problem Relation Age of Onset  . Heart attack Father   . Cancer - Colon Mother     colon    ROS:  Pertinent items are noted in HPI.  Otherwise, a comprehensive ROS was negative.  Exam:   BP 124/86 mmHg  Pulse 88  Ht 5' 2.5" (1.588 m)  Wt 130 lb (58.968 kg)  BMI 23.38 kg/m2  LMP 11/14/1978 (Approximate) Height: 5' 2.5" (158.8 cm) Ht Readings from Last 3 Encounters:  04/12/16 5' 2.5" (1.588 m)  04/06/15 5\' 3"  (1.6 m)  04/03/14 5\' 3"  (1.6  m)    General appearance: alert, cooperative and appears stated age Head: Normocephalic, without obvious abnormality, atraumatic Neck: no adenopathy, supple, symmetrical, trachea midline and thyroid normal to inspection and palpation Lungs: clear to auscultation bilaterally Breasts: normal appearance, no masses or tenderness Heart: regular rate and rhythm Abdomen: soft, non-tender; no masses,  no organomegaly Extremities: extremities normal, atraumatic, no cyanosis or edema Skin: Skin color, texture, turgor normal. No rashes or lesions Lymph nodes: Cervical, supraclavicular, and axillary nodes normal. No abnormal inguinal nodes palpated Neurologic: Grossly normal   Pelvic: External genitalia:  no lesions              Urethra:  normal appearing urethra with no masses, tenderness or lesions              Bartholin's and Skene's: normal                 Vagina: normal appearing  vagina with normal color and discharge, no lesions              Cervix: anteverted              Pap taken: No. Bimanual Exam:  Uterus:  normal size, contour, position, consistency, mobility, non-tender              Adnexa: no mass, fullness, tenderness               Rectovaginal: Confirms               Anus:  normal sphincter tone, no lesions  Chaperone present: yes  A:  Well Woman with normal exam  Postmenopausal History of LSA without current flare Atrophic vaginitis History of Vit D deficiency  History of stress incontinence   P:   Reviewed health and wellness pertinent to exam  Pap smear as above  Mammogram is past due - she is aware needs to be done to continue with hormone vaginal cream.  Does not need a refill on Temovate at this time.  Will not get vaginal estrogen at this time.  Will make a referral for her to get Uro PT  Counseled on breast self exam, mammography screening, use and side effects of HRT, adequate intake of calcium and vitamin D, diet and exercise return annually or prn  An After Visit Summary was printed and given to the patient.

## 2016-04-13 NOTE — Progress Notes (Signed)
Reviewed personally.  M. Suzanne Deedee Lybarger, MD.  

## 2016-04-25 DIAGNOSIS — M6281 Muscle weakness (generalized): Secondary | ICD-10-CM | POA: Diagnosis not present

## 2016-04-25 DIAGNOSIS — R32 Unspecified urinary incontinence: Secondary | ICD-10-CM | POA: Diagnosis not present

## 2016-04-25 DIAGNOSIS — N3944 Nocturnal enuresis: Secondary | ICD-10-CM | POA: Diagnosis not present

## 2016-04-25 DIAGNOSIS — R278 Other lack of coordination: Secondary | ICD-10-CM | POA: Diagnosis not present

## 2016-05-02 DIAGNOSIS — R351 Nocturia: Secondary | ICD-10-CM | POA: Diagnosis not present

## 2016-05-02 DIAGNOSIS — R32 Unspecified urinary incontinence: Secondary | ICD-10-CM | POA: Diagnosis not present

## 2016-05-02 DIAGNOSIS — M6281 Muscle weakness (generalized): Secondary | ICD-10-CM | POA: Diagnosis not present

## 2016-05-02 DIAGNOSIS — N3944 Nocturnal enuresis: Secondary | ICD-10-CM | POA: Diagnosis not present

## 2016-05-02 DIAGNOSIS — R278 Other lack of coordination: Secondary | ICD-10-CM | POA: Diagnosis not present

## 2016-05-06 DIAGNOSIS — D2372 Other benign neoplasm of skin of left lower limb, including hip: Secondary | ICD-10-CM | POA: Diagnosis not present

## 2016-05-10 DIAGNOSIS — N3944 Nocturnal enuresis: Secondary | ICD-10-CM | POA: Diagnosis not present

## 2016-05-10 DIAGNOSIS — R32 Unspecified urinary incontinence: Secondary | ICD-10-CM | POA: Diagnosis not present

## 2016-05-10 DIAGNOSIS — M6281 Muscle weakness (generalized): Secondary | ICD-10-CM | POA: Diagnosis not present

## 2016-05-10 DIAGNOSIS — R278 Other lack of coordination: Secondary | ICD-10-CM | POA: Diagnosis not present

## 2016-05-10 DIAGNOSIS — R351 Nocturia: Secondary | ICD-10-CM | POA: Diagnosis not present

## 2016-05-11 DIAGNOSIS — M818 Other osteoporosis without current pathological fracture: Secondary | ICD-10-CM | POA: Diagnosis not present

## 2016-05-11 DIAGNOSIS — Z0000 Encounter for general adult medical examination without abnormal findings: Secondary | ICD-10-CM | POA: Diagnosis not present

## 2016-05-23 DIAGNOSIS — N3944 Nocturnal enuresis: Secondary | ICD-10-CM | POA: Diagnosis not present

## 2016-05-23 DIAGNOSIS — R32 Unspecified urinary incontinence: Secondary | ICD-10-CM | POA: Diagnosis not present

## 2016-05-23 DIAGNOSIS — M6281 Muscle weakness (generalized): Secondary | ICD-10-CM | POA: Diagnosis not present

## 2016-05-23 DIAGNOSIS — R278 Other lack of coordination: Secondary | ICD-10-CM | POA: Diagnosis not present

## 2016-05-23 DIAGNOSIS — R351 Nocturia: Secondary | ICD-10-CM | POA: Diagnosis not present

## 2016-06-01 DIAGNOSIS — N3944 Nocturnal enuresis: Secondary | ICD-10-CM | POA: Diagnosis not present

## 2016-06-01 DIAGNOSIS — R278 Other lack of coordination: Secondary | ICD-10-CM | POA: Diagnosis not present

## 2016-06-01 DIAGNOSIS — M6281 Muscle weakness (generalized): Secondary | ICD-10-CM | POA: Diagnosis not present

## 2016-06-01 DIAGNOSIS — R32 Unspecified urinary incontinence: Secondary | ICD-10-CM | POA: Diagnosis not present

## 2016-06-01 DIAGNOSIS — R351 Nocturia: Secondary | ICD-10-CM | POA: Diagnosis not present

## 2016-06-07 DIAGNOSIS — N3944 Nocturnal enuresis: Secondary | ICD-10-CM | POA: Diagnosis not present

## 2016-06-07 DIAGNOSIS — R278 Other lack of coordination: Secondary | ICD-10-CM | POA: Diagnosis not present

## 2016-06-07 DIAGNOSIS — R32 Unspecified urinary incontinence: Secondary | ICD-10-CM | POA: Diagnosis not present

## 2016-06-07 DIAGNOSIS — M6281 Muscle weakness (generalized): Secondary | ICD-10-CM | POA: Diagnosis not present

## 2016-06-07 DIAGNOSIS — R351 Nocturia: Secondary | ICD-10-CM | POA: Diagnosis not present

## 2016-06-17 DIAGNOSIS — M6281 Muscle weakness (generalized): Secondary | ICD-10-CM | POA: Diagnosis not present

## 2016-06-17 DIAGNOSIS — R278 Other lack of coordination: Secondary | ICD-10-CM | POA: Diagnosis not present

## 2016-06-17 DIAGNOSIS — N3944 Nocturnal enuresis: Secondary | ICD-10-CM | POA: Diagnosis not present

## 2016-06-17 DIAGNOSIS — R32 Unspecified urinary incontinence: Secondary | ICD-10-CM | POA: Diagnosis not present

## 2016-06-17 DIAGNOSIS — R351 Nocturia: Secondary | ICD-10-CM | POA: Diagnosis not present

## 2016-06-27 DIAGNOSIS — M6281 Muscle weakness (generalized): Secondary | ICD-10-CM | POA: Diagnosis not present

## 2016-06-27 DIAGNOSIS — N3946 Mixed incontinence: Secondary | ICD-10-CM | POA: Diagnosis not present

## 2016-06-27 DIAGNOSIS — R278 Other lack of coordination: Secondary | ICD-10-CM | POA: Diagnosis not present

## 2016-07-21 DIAGNOSIS — M6281 Muscle weakness (generalized): Secondary | ICD-10-CM | POA: Diagnosis not present

## 2016-07-21 DIAGNOSIS — R278 Other lack of coordination: Secondary | ICD-10-CM | POA: Diagnosis not present

## 2016-07-21 DIAGNOSIS — N3946 Mixed incontinence: Secondary | ICD-10-CM | POA: Diagnosis not present

## 2016-07-21 DIAGNOSIS — R351 Nocturia: Secondary | ICD-10-CM | POA: Diagnosis not present

## 2016-08-08 DIAGNOSIS — R278 Other lack of coordination: Secondary | ICD-10-CM | POA: Diagnosis not present

## 2016-08-08 DIAGNOSIS — N3946 Mixed incontinence: Secondary | ICD-10-CM | POA: Diagnosis not present

## 2016-08-08 DIAGNOSIS — M6281 Muscle weakness (generalized): Secondary | ICD-10-CM | POA: Diagnosis not present

## 2016-08-26 DIAGNOSIS — R278 Other lack of coordination: Secondary | ICD-10-CM | POA: Diagnosis not present

## 2016-08-26 DIAGNOSIS — N3944 Nocturnal enuresis: Secondary | ICD-10-CM | POA: Diagnosis not present

## 2016-08-26 DIAGNOSIS — R351 Nocturia: Secondary | ICD-10-CM | POA: Diagnosis not present

## 2016-08-26 DIAGNOSIS — M6281 Muscle weakness (generalized): Secondary | ICD-10-CM | POA: Diagnosis not present

## 2016-08-26 DIAGNOSIS — N3946 Mixed incontinence: Secondary | ICD-10-CM | POA: Diagnosis not present

## 2016-09-12 DIAGNOSIS — H5203 Hypermetropia, bilateral: Secondary | ICD-10-CM | POA: Diagnosis not present

## 2016-09-12 DIAGNOSIS — Z0100 Encounter for examination of eyes and vision without abnormal findings: Secondary | ICD-10-CM | POA: Diagnosis not present

## 2016-09-12 DIAGNOSIS — H524 Presbyopia: Secondary | ICD-10-CM | POA: Diagnosis not present

## 2016-09-12 DIAGNOSIS — H52223 Regular astigmatism, bilateral: Secondary | ICD-10-CM | POA: Diagnosis not present

## 2016-09-12 DIAGNOSIS — Z961 Presence of intraocular lens: Secondary | ICD-10-CM | POA: Diagnosis not present

## 2016-09-19 DIAGNOSIS — N3944 Nocturnal enuresis: Secondary | ICD-10-CM | POA: Diagnosis not present

## 2016-09-19 DIAGNOSIS — M6281 Muscle weakness (generalized): Secondary | ICD-10-CM | POA: Diagnosis not present

## 2016-09-19 DIAGNOSIS — R278 Other lack of coordination: Secondary | ICD-10-CM | POA: Diagnosis not present

## 2016-09-19 DIAGNOSIS — N3946 Mixed incontinence: Secondary | ICD-10-CM | POA: Diagnosis not present

## 2016-09-19 DIAGNOSIS — R351 Nocturia: Secondary | ICD-10-CM | POA: Diagnosis not present

## 2016-09-22 DIAGNOSIS — Z23 Encounter for immunization: Secondary | ICD-10-CM | POA: Diagnosis not present

## 2016-09-22 DIAGNOSIS — D225 Melanocytic nevi of trunk: Secondary | ICD-10-CM | POA: Diagnosis not present

## 2016-09-22 DIAGNOSIS — L989 Disorder of the skin and subcutaneous tissue, unspecified: Secondary | ICD-10-CM | POA: Diagnosis not present

## 2016-09-22 DIAGNOSIS — D1801 Hemangioma of skin and subcutaneous tissue: Secondary | ICD-10-CM | POA: Diagnosis not present

## 2016-09-22 DIAGNOSIS — L814 Other melanin hyperpigmentation: Secondary | ICD-10-CM | POA: Diagnosis not present

## 2016-09-22 DIAGNOSIS — L821 Other seborrheic keratosis: Secondary | ICD-10-CM | POA: Diagnosis not present

## 2016-10-10 DIAGNOSIS — R278 Other lack of coordination: Secondary | ICD-10-CM | POA: Diagnosis not present

## 2016-10-21 DIAGNOSIS — M722 Plantar fascial fibromatosis: Secondary | ICD-10-CM | POA: Diagnosis not present

## 2016-11-17 DIAGNOSIS — M722 Plantar fascial fibromatosis: Secondary | ICD-10-CM | POA: Diagnosis not present

## 2016-12-21 DIAGNOSIS — M810 Age-related osteoporosis without current pathological fracture: Secondary | ICD-10-CM | POA: Diagnosis not present

## 2016-12-21 DIAGNOSIS — Z0000 Encounter for general adult medical examination without abnormal findings: Secondary | ICD-10-CM | POA: Diagnosis not present

## 2016-12-21 DIAGNOSIS — E559 Vitamin D deficiency, unspecified: Secondary | ICD-10-CM | POA: Diagnosis not present

## 2016-12-21 DIAGNOSIS — Z7982 Long term (current) use of aspirin: Secondary | ICD-10-CM | POA: Diagnosis not present

## 2016-12-26 DIAGNOSIS — R9431 Abnormal electrocardiogram [ECG] [EKG]: Secondary | ICD-10-CM | POA: Diagnosis not present

## 2016-12-26 DIAGNOSIS — Z0001 Encounter for general adult medical examination with abnormal findings: Secondary | ICD-10-CM | POA: Diagnosis not present

## 2016-12-26 DIAGNOSIS — H9319 Tinnitus, unspecified ear: Secondary | ICD-10-CM | POA: Diagnosis not present

## 2016-12-26 DIAGNOSIS — J309 Allergic rhinitis, unspecified: Secondary | ICD-10-CM | POA: Diagnosis not present

## 2017-01-16 ENCOUNTER — Telehealth: Payer: Self-pay | Admitting: Nurse Practitioner

## 2017-01-16 NOTE — Telephone Encounter (Signed)
Patient is asking to talk with Edman Circle, FNP directly. Patient said "I have a question for her". Patient declined a consult appointment.

## 2017-01-16 NOTE — Telephone Encounter (Signed)
Kem Boroughs, NP -see patient message below.

## 2017-01-17 NOTE — Telephone Encounter (Signed)
Pt was called back and she is concerned about a friend that helps her out at the home.  The friend was going through a crisis and just needed to talk about things.  She is going to try and get the friend to appropriate medical provider.  She was appreciative of  Call.

## 2017-02-06 ENCOUNTER — Telehealth: Payer: Self-pay | Admitting: Nurse Practitioner

## 2017-02-06 NOTE — Telephone Encounter (Signed)
Patient is asking to talk with Edman Circle, FNP directly. Patient said "Chong Sicilian knows what this is about, her friend Maudie Mercury" who is not yet a patient.

## 2017-02-07 NOTE — Telephone Encounter (Signed)
Pt was called back about her friend that she is trying to get in for at least a consult visit.  She will offer to come with pt.

## 2017-04-13 ENCOUNTER — Encounter: Payer: Self-pay | Admitting: Nurse Practitioner

## 2017-04-13 NOTE — Progress Notes (Signed)
Patient ID: Katie Rubio, female   DOB: 14-Jun-1929, 83 y.o.   MRN: [id number]  83 y.o. [id number] Married Caucasian Fe here for annual exam.    Doing well on med's with vaginal estrogen to the urethra.  Has not been back to urologist as she has had less infections.  She does have ongoing problems with constipation.  The Clobetasol for the LSA is used rarely but she does feel some discomfort with wiping at the perianal area.  She was unsure if this was from [facility] or rectal irritation from constipation.  Patient's last menstrual period was 11/14/1978 (approximate).          Sexually active: No.  The current method of family planning is abstinence.    Exercising: Yes.    back and leg exercises at home Smoker:  no  Health Maintenance: Pap: 04/06/15, Negative  03/23/11, Negative History of Abnormal Pap: no MMG: 09/24/08, 3D-no, Density Category B, Bi-Rads 1:  Negative Self Breast exams: sometimes Colonoscopy: 01/2011, 3 polyps, recall in 5 years BMD: 09/17/13 T Score: -1.3 Spine / -2.5 Right Femur Neck / -2.8 Left Femur Neck, may have had repeat in 2017 with Dr. Shelia Media TDaP: 11/2003, unsure if this has been updated with Dr. Shelia Media Shingles: never Pneumonia: never Hep C and HIV: Not indicated due to age Labs: PCP takes care of screening labs    reports that she has never smoked. She has never used smokeless tobacco. She reports that she does not drink alcohol or use drugs.  Past Medical History:  Diagnosis Date  . Allergic rhinitis   . Atrophic vaginitis   . Dizziness   . History of anxiety   . History of rheumatic fever   . Macrocytosis    History of chronic macrocytosis  . Meralgia paresthetica    History of  . Osteoporosis   . Palpitations    occasional  . Post-menopausal   . Sclerosis of the skin    lichen  . Syncope   . Tinnitus    Chronic    Past Surgical History:  Procedure Laterality Date  . colon polyps      Current Outpatient Prescriptions  Medication Sig Dispense  Refill  . Cholecalciferol (VITAMIN D) 2000 UNITS CAPS Take 2,000 Units by mouth every other day.     . clobetasol cream (TEMOVATE) 0.05 % Apply topically as needed. 30 g 3  . Coenzyme Q10 (CO Q 10 PO) Take by mouth every other day.    . estradiol (ESTRACE) 0.1 MG/GM vaginal cream Place 3.29 Applicatorfuls vaginally 2 (two) times a week. 42.5 g 3  . Flaxseed, Linseed, (FLAXSEED OIL) 1000 MG CAPS Take by mouth daily.    . magnesium 30 MG tablet Take 30 mg by mouth daily.    . Nutritional Supplements (JUICE PLUS FIBRE PO) Take by mouth daily.    . Omega-3 Fatty Acids (FISH OIL PO) Take by mouth daily.      No current facility-administered medications for this visit.     Family History  Problem Relation Age of Onset  . Heart attack Father   . Cancer - Colon Mother        colon    ROS:  Pertinent items are noted in HPI.  Otherwise, a comprehensive ROS was negative.  Exam:   BP (!) 142/80 (BP Location: Left Arm, Patient Position: Sitting, Cuff Size: Normal)   Pulse 60   Ht 5' 2.5" (1.588 m)   Wt 126 lb (57.2 kg)  LMP 11/14/1978 (Approximate)   BMI 22.68 kg/m  Height: 5' 2.5" (158.8 cm) Ht Readings from Last 3 Encounters:  04/17/17 5' 2.5" (1.588 m)  04/12/16 5' 2.5" (1.588 m)  04/06/15 5\' 3"  (1.6 m)    General appearance: alert, cooperative and appears stated age Head: Normocephalic, without obvious abnormality, atraumatic Neck: no adenopathy, supple, symmetrical, trachea midline and thyroid normal to inspection and palpation Lungs: clear to auscultation bilaterally Breasts: normal appearance, no masses or tenderness Heart: regular rate and rhythm Abdomen: soft, non-tender; no masses,  no organomegaly Extremities: extremities normal, atraumatic, no cyanosis or edema Skin: Skin color, texture, turgor normal. No rashes or lesions Lymph nodes: Cervical, supraclavicular, and axillary nodes normal. No abnormal inguinal nodes palpated Neurologic: Grossly normal   Pelvic:  External genitalia:  no lesions              Urethra:  normal appearing urethra with no masses, tenderness or lesions              Bartholin's and Skene's: normal                 Vagina: normal appearing vagina with normal color and discharge, no lesions              Cervix: anteverted              Pap taken: No. Bimanual Exam:  Uterus:  normal size, contour, position, consistency, mobility, non-tender              Adnexa: no mass, fullness, tenderness               Rectovaginal: Confirms               Anus:  normal sphincter tone, no lesions  Chaperone present: yes  A:  Well Woman with normal exam  Postmenopausal  LSA with current flare  Atrophic vaginitis  History of Vit D deficiency  History of stress incontinence - less flares of UTI with use of vaginal E to the urethra.   P:   Reviewed health and wellness pertinent to exam  Pap smear: no  Mammogram is due but she wants to wait until after grandson's wedding this June  Counseled on use of estrogen cream and risk for DVT, CV, cancer  Refill on Temovate for LSA with instructions for use BID for 7-10 days.  Counseled on breast self exam, mammography screening, use and side effects of HRT, adequate intake of calcium and vitamin D, diet and exercise return annually or prn  An After Visit Summary was printed and given to the patient.

## 2017-04-17 ENCOUNTER — Ambulatory Visit (INDEPENDENT_AMBULATORY_CARE_PROVIDER_SITE_OTHER): Payer: Medicare HMO | Admitting: Nurse Practitioner

## 2017-04-17 ENCOUNTER — Encounter: Payer: Self-pay | Admitting: Nurse Practitioner

## 2017-04-17 VITALS — BP 142/80 | HR 60 | Ht 62.5 in | Wt 126.0 lb

## 2017-04-17 DIAGNOSIS — N904 Leukoplakia of vulva: Secondary | ICD-10-CM | POA: Diagnosis not present

## 2017-04-17 DIAGNOSIS — Z01411 Encounter for gynecological examination (general) (routine) with abnormal findings: Secondary | ICD-10-CM

## 2017-04-17 DIAGNOSIS — N952 Postmenopausal atrophic vaginitis: Secondary | ICD-10-CM | POA: Diagnosis not present

## 2017-04-17 MED ORDER — CLOBETASOL PROPIONATE 0.05 % EX CREA: TOPICAL_CREAM | CUTANEOUS | 3 refills | Status: DC | PRN

## 2017-04-17 MED ORDER — ESTRADIOL 0.1 MG/GM VA CREA: 1.0000 g | TOPICAL_CREAM | VAGINAL | 3 refills | Status: DC

## 2017-04-17 NOTE — Patient Instructions (Signed)

## 2017-04-19 NOTE — Progress Notes (Signed)
Encounter reviewed Jill Jertson, MD   

## 2017-04-25 DIAGNOSIS — M79672 Pain in left foot: Secondary | ICD-10-CM | POA: Diagnosis not present

## 2017-05-10 DIAGNOSIS — M21962 Unspecified acquired deformity of left lower leg: Secondary | ICD-10-CM | POA: Diagnosis not present

## 2017-05-10 DIAGNOSIS — M79672 Pain in left foot: Secondary | ICD-10-CM | POA: Diagnosis not present

## 2017-07-21 DIAGNOSIS — S9031XA Contusion of right foot, initial encounter: Secondary | ICD-10-CM | POA: Diagnosis not present

## 2017-07-21 DIAGNOSIS — M79672 Pain in left foot: Secondary | ICD-10-CM | POA: Diagnosis not present

## 2017-07-21 DIAGNOSIS — M79671 Pain in right foot: Secondary | ICD-10-CM | POA: Diagnosis not present

## 2017-09-13 DIAGNOSIS — H524 Presbyopia: Secondary | ICD-10-CM | POA: Diagnosis not present

## 2017-09-26 DIAGNOSIS — L853 Xerosis cutis: Secondary | ICD-10-CM | POA: Diagnosis not present

## 2017-09-26 DIAGNOSIS — D1801 Hemangioma of skin and subcutaneous tissue: Secondary | ICD-10-CM | POA: Diagnosis not present

## 2017-09-26 DIAGNOSIS — L84 Corns and callosities: Secondary | ICD-10-CM | POA: Diagnosis not present

## 2017-09-26 DIAGNOSIS — Z23 Encounter for immunization: Secondary | ICD-10-CM | POA: Diagnosis not present

## 2017-09-26 DIAGNOSIS — L821 Other seborrheic keratosis: Secondary | ICD-10-CM | POA: Diagnosis not present

## 2017-09-26 DIAGNOSIS — D225 Melanocytic nevi of trunk: Secondary | ICD-10-CM | POA: Diagnosis not present

## 2017-09-26 DIAGNOSIS — L814 Other melanin hyperpigmentation: Secondary | ICD-10-CM | POA: Diagnosis not present

## 2017-09-26 DIAGNOSIS — L219 Seborrheic dermatitis, unspecified: Secondary | ICD-10-CM | POA: Diagnosis not present

## 2017-09-26 DIAGNOSIS — L570 Actinic keratosis: Secondary | ICD-10-CM | POA: Diagnosis not present

## 2017-10-25 DIAGNOSIS — S9031XA Contusion of right foot, initial encounter: Secondary | ICD-10-CM | POA: Diagnosis not present

## 2017-10-25 DIAGNOSIS — M79672 Pain in left foot: Secondary | ICD-10-CM | POA: Diagnosis not present

## 2017-10-25 DIAGNOSIS — M79671 Pain in right foot: Secondary | ICD-10-CM | POA: Diagnosis not present

## 2017-10-25 DIAGNOSIS — M21962 Unspecified acquired deformity of left lower leg: Secondary | ICD-10-CM | POA: Diagnosis not present

## 2017-11-29 DIAGNOSIS — M810 Age-related osteoporosis without current pathological fracture: Secondary | ICD-10-CM | POA: Diagnosis not present

## 2017-11-29 DIAGNOSIS — Z8249 Family history of ischemic heart disease and other diseases of the circulatory system: Secondary | ICD-10-CM | POA: Diagnosis not present

## 2017-11-29 DIAGNOSIS — Z809 Family history of malignant neoplasm, unspecified: Secondary | ICD-10-CM | POA: Diagnosis not present

## 2017-11-29 DIAGNOSIS — R32 Unspecified urinary incontinence: Secondary | ICD-10-CM | POA: Diagnosis not present

## 2017-11-29 DIAGNOSIS — K5900 Constipation, unspecified: Secondary | ICD-10-CM | POA: Diagnosis not present

## 2018-01-01 DIAGNOSIS — Z7982 Long term (current) use of aspirin: Secondary | ICD-10-CM | POA: Diagnosis not present

## 2018-01-01 DIAGNOSIS — D72819 Decreased white blood cell count, unspecified: Secondary | ICD-10-CM | POA: Diagnosis not present

## 2018-01-01 DIAGNOSIS — E559 Vitamin D deficiency, unspecified: Secondary | ICD-10-CM | POA: Diagnosis not present

## 2018-01-01 DIAGNOSIS — M810 Age-related osteoporosis without current pathological fracture: Secondary | ICD-10-CM | POA: Diagnosis not present

## 2018-01-01 DIAGNOSIS — Z13220 Encounter for screening for lipoid disorders: Secondary | ICD-10-CM | POA: Diagnosis not present

## 2018-01-04 DIAGNOSIS — G250 Essential tremor: Secondary | ICD-10-CM | POA: Diagnosis not present

## 2018-01-04 DIAGNOSIS — M25511 Pain in right shoulder: Secondary | ICD-10-CM | POA: Diagnosis not present

## 2018-01-04 DIAGNOSIS — M810 Age-related osteoporosis without current pathological fracture: Secondary | ICD-10-CM | POA: Diagnosis not present

## 2018-01-04 DIAGNOSIS — R9431 Abnormal electrocardiogram [ECG] [EKG]: Secondary | ICD-10-CM | POA: Diagnosis not present

## 2018-01-04 DIAGNOSIS — D72819 Decreased white blood cell count, unspecified: Secondary | ICD-10-CM | POA: Diagnosis not present

## 2018-01-04 DIAGNOSIS — H9319 Tinnitus, unspecified ear: Secondary | ICD-10-CM | POA: Diagnosis not present

## 2018-01-04 DIAGNOSIS — Z0001 Encounter for general adult medical examination with abnormal findings: Secondary | ICD-10-CM | POA: Diagnosis not present

## 2018-01-04 DIAGNOSIS — M419 Scoliosis, unspecified: Secondary | ICD-10-CM | POA: Diagnosis not present

## 2018-01-04 DIAGNOSIS — M545 Low back pain: Secondary | ICD-10-CM | POA: Diagnosis not present

## 2018-01-04 DIAGNOSIS — G4762 Sleep related leg cramps: Secondary | ICD-10-CM | POA: Diagnosis not present

## 2018-01-04 DIAGNOSIS — R222 Localized swelling, mass and lump, trunk: Secondary | ICD-10-CM | POA: Diagnosis not present

## 2018-01-09 DIAGNOSIS — M810 Age-related osteoporosis without current pathological fracture: Secondary | ICD-10-CM | POA: Diagnosis not present

## 2018-01-16 DIAGNOSIS — Z1212 Encounter for screening for malignant neoplasm of rectum: Secondary | ICD-10-CM | POA: Diagnosis not present

## 2018-03-12 DIAGNOSIS — M545 Low back pain: Secondary | ICD-10-CM | POA: Diagnosis not present

## 2018-03-12 DIAGNOSIS — M810 Age-related osteoporosis without current pathological fracture: Secondary | ICD-10-CM | POA: Diagnosis not present

## 2018-03-12 DIAGNOSIS — M419 Scoliosis, unspecified: Secondary | ICD-10-CM | POA: Diagnosis not present

## 2018-04-16 NOTE — Progress Notes (Signed)
84 y.o. [id number] MarriedCaucasianF here for annual exam.  She has a h/o lichen sclerosis, intermittently bothers her. She uses steroid ointments as needed. She is having some perianal itching, uses steroids off and on. She has chronic constipation, helped with eating prunes. BM can get hard and then are painful.  She has a h/o urge incontinence, small amounts, worse at night. Has had help with pelvic floor PT.  No vaginal bleeding. Not sexually active.   She recently had a bone density and has an appointment to discuss the results with her primary. H/O osteoporosis. She has back pain.     Patient's last menstrual period was 11/14/1978 (approximate).          Sexually active: No.  The current method of family planning is post menopausal status.    Exercising: Yes.    housework, gardening Smoker:  no  Health Maintenance: Pap: 04-06-15 Neg, 03-23-11 Neg History of abnormal Pap:  no MMG:  09/2008 3D/Density B/Neg/BiRads1, discussed screening Colonoscopy:  01/2011 polyps;--PCP states no longer needed. BMD:  02/2018 with Dr.Pharr--abnormal and suggested treatment--she has appointment to discuss TDaP:  PCP Gardasil: no   reports that she has never smoked. She has never used smokeless tobacco. She reports that she does not drink alcohol or use drugs. Had 5 children, son died at 62. Has 4 living children, 8 grand children and 3 great grand children.   Past Medical History:  Diagnosis Date  . Allergic rhinitis   . Atrophic vaginitis   . Dizziness   . History of anxiety   . History of rheumatic fever   . Macrocytosis    History of chronic macrocytosis  . Meralgia paresthetica    History of  . Osteoporosis   . Palpitations    occasional  . Post-menopausal   . Sclerosis of the skin    lichen  . Syncope   . Tinnitus    Chronic    Past Surgical History:  Procedure Laterality Date  . colon polyps      Current Outpatient Medications  Medication Sig Dispense Refill  . Cholecalciferol  (VITAMIN D) 2000 UNITS CAPS Take 2,000 Units by mouth every other day.     . clobetasol cream (TEMOVATE) 0.05 % Apply topically as needed. 30 g 3  . Coenzyme Q10 (CO Q 10 PO) Take by mouth every other day.    . estradiol (ESTRACE) 0.1 MG/GM vaginal cream Place 1 g vaginally 2 (two) times a week. 42.5 g 3  . Flaxseed, Linseed, (FLAXSEED OIL) 1000 MG CAPS Take by mouth daily.    . magnesium 30 MG tablet Take 30 mg by mouth daily.    . Nutritional Supplements (JUICE PLUS FIBRE PO) Take by mouth daily.    . Omega-3 Fatty Acids (FISH OIL PO) Take by mouth daily.      No current facility-administered medications for this visit.     Family History  Problem Relation Age of Onset  . Heart attack Father   . Cancer - Colon Mother        colon    Review of Systems  Constitutional: Negative.   HENT: Negative.   Eyes: Negative.   Respiratory: Negative.   Cardiovascular: Negative.   Gastrointestinal: Negative.   Endocrine: Negative.   Genitourinary: Negative.   Musculoskeletal:       Muscle and joint pain  Skin: Negative.   Allergic/Immunologic: Negative.   Neurological: Negative.   Psychiatric/Behavioral: Negative.     Exam:   BP (!) 160/78 (  BP Location: Right Arm, Patient Position: Sitting, Cuff Size: Normal)   Pulse 88   Resp 14   Ht 5\' 2"  (1.575 m)   Wt 123 lb (55.8 kg)   LMP 11/14/1978 (Approximate)   BMI 22.50 kg/m   Weight change: @WEIGHTCHANGE @ Height:   Height: 5\' 2"  (157.5 cm)  Ht Readings from Last 3 Encounters:  04/18/18 5\' 2"  (1.575 m)  04/17/17 5' 2.5" (1.588 m)  04/12/16 5' 2.5" (1.588 m)    General appearance: alert, cooperative and appears stated age Head: Normocephalic, without obvious abnormality, atraumatic Neck: no adenopathy, supple, symmetrical, trachea midline and thyroid normal to inspection and palpation Lungs: clear to auscultation bilaterally Cardiovascular: regular rate and rhythm Breasts: normal appearance, no masses or tenderness Abdomen: soft,  non-tender; non distended,  no masses,  no organomegaly Extremities: extremities normal, atraumatic, no cyanosis or edema Skin: Skin color, texture, turgor normal. No rashes or lesions Lymph nodes: Cervical, supraclavicular, and axillary nodes normal. No abnormal inguinal nodes palpated Neurologic: Grossly normal   Pelvic: External genitalia:  no lesions, she has mild whitening and mild agglutination c/w lichen sclerosis, no lesions, fissure or plaques              Urethra:  normal appearing urethra with no masses, tenderness or lesions              Bartholins and Skenes: normal                 Vagina: normal appearing vagina with normal color and discharge, no lesions              Cervix: no lesions               Bimanual Exam:  Uterus:  normal size, contour, position, consistency, mobility, non-tender              Adnexa: no mass, fullness, tenderness               Rectovaginal: Confirms               Anus:  normal sphincter tone, no lesions. She has a severe perianal dermatitis, with fissures in the skin, erythema and whitening posterior to the anus  Chaperone was present for exam.  A:  Well Woman with normal exam  Osteoporosis  H/O lichen sclerosis, stable  Severe perianal dermatitis  Urge incontinence, previously helped with PT    P:   F/U in 2 weeks for f/u of dermatitis, discussed steroids and Vaseline use. She would prefer to use Vaseline for 2 weeks and see if that helps.   Discussed risks of daily use of steroids (she has been using every few days off and on)  Information on vulvar/perinanal skin care given  No pap needed  Discussed mammogram and option of screening (likely still has a 10 year life span), she will consider  She is f/u with her primary to discuss her osteoporosis, I discussed the benefit of treatment with her  Labs with primary MD  Discussed breast self exam  Discussed calcium and vit D intake

## 2018-04-18 ENCOUNTER — Ambulatory Visit: Payer: Medicare HMO | Admitting: Nurse Practitioner

## 2018-04-18 ENCOUNTER — Other Ambulatory Visit: Payer: Self-pay

## 2018-04-18 ENCOUNTER — Encounter: Payer: Self-pay | Admitting: Obstetrics and Gynecology

## 2018-04-18 ENCOUNTER — Ambulatory Visit (INDEPENDENT_AMBULATORY_CARE_PROVIDER_SITE_OTHER): Payer: Medicare HMO | Admitting: Obstetrics and Gynecology

## 2018-04-18 VITALS — BP 160/90 | HR 88 | Resp 14 | Ht 62.0 in | Wt 123.0 lb

## 2018-04-18 DIAGNOSIS — N3941 Urge incontinence: Secondary | ICD-10-CM | POA: Diagnosis not present

## 2018-04-18 DIAGNOSIS — M810 Age-related osteoporosis without current pathological fracture: Secondary | ICD-10-CM | POA: Diagnosis not present

## 2018-04-18 DIAGNOSIS — L309 Dermatitis, unspecified: Secondary | ICD-10-CM | POA: Diagnosis not present

## 2018-04-18 DIAGNOSIS — Z01419 Encounter for gynecological examination (general) (routine) without abnormal findings: Secondary | ICD-10-CM

## 2018-04-18 DIAGNOSIS — L900 Lichen sclerosus et atrophicus: Secondary | ICD-10-CM

## 2018-04-18 NOTE — Patient Instructions (Addendum)

## 2018-04-18 NOTE — Progress Notes (Signed)
Patient's BP was high in office x 3. Called to patient's PCP office. Appointments available on Friday and Monday. Patient has family coming into town and declines to schedule at this time. States she will speak with her family and call her PCP to schedule this appointment. Declines further assistance. Aware of the importance of following up with PCP.

## 2018-05-04 ENCOUNTER — Ambulatory Visit: Payer: Medicare HMO | Admitting: Obstetrics and Gynecology

## 2018-06-06 DIAGNOSIS — M810 Age-related osteoporosis without current pathological fracture: Secondary | ICD-10-CM | POA: Diagnosis not present

## 2018-06-06 DIAGNOSIS — M5440 Lumbago with sciatica, unspecified side: Secondary | ICD-10-CM | POA: Diagnosis not present

## 2018-06-06 DIAGNOSIS — H8110 Benign paroxysmal vertigo, unspecified ear: Secondary | ICD-10-CM | POA: Diagnosis not present

## 2018-06-06 DIAGNOSIS — I10 Essential (primary) hypertension: Secondary | ICD-10-CM | POA: Diagnosis not present

## 2018-06-20 DIAGNOSIS — M4186 Other forms of scoliosis, lumbar region: Secondary | ICD-10-CM | POA: Diagnosis not present

## 2018-06-20 DIAGNOSIS — M545 Low back pain: Secondary | ICD-10-CM | POA: Diagnosis not present

## 2018-08-17 DIAGNOSIS — M79672 Pain in left foot: Secondary | ICD-10-CM | POA: Diagnosis not present

## 2018-08-17 DIAGNOSIS — B353 Tinea pedis: Secondary | ICD-10-CM | POA: Diagnosis not present

## 2018-08-17 DIAGNOSIS — L603 Nail dystrophy: Secondary | ICD-10-CM | POA: Diagnosis not present

## 2018-08-17 DIAGNOSIS — L84 Corns and callosities: Secondary | ICD-10-CM | POA: Diagnosis not present

## 2018-08-17 DIAGNOSIS — L602 Onychogryphosis: Secondary | ICD-10-CM | POA: Diagnosis not present

## 2018-08-27 DIAGNOSIS — L603 Nail dystrophy: Secondary | ICD-10-CM | POA: Diagnosis not present

## 2018-09-06 DIAGNOSIS — L602 Onychogryphosis: Secondary | ICD-10-CM | POA: Diagnosis not present

## 2018-09-06 DIAGNOSIS — M79675 Pain in left toe(s): Secondary | ICD-10-CM | POA: Diagnosis not present

## 2018-10-08 DIAGNOSIS — L219 Seborrheic dermatitis, unspecified: Secondary | ICD-10-CM | POA: Diagnosis not present

## 2018-10-08 DIAGNOSIS — D2239 Melanocytic nevi of other parts of face: Secondary | ICD-10-CM | POA: Diagnosis not present

## 2018-10-08 DIAGNOSIS — D225 Melanocytic nevi of trunk: Secondary | ICD-10-CM | POA: Diagnosis not present

## 2018-10-08 DIAGNOSIS — L814 Other melanin hyperpigmentation: Secondary | ICD-10-CM | POA: Diagnosis not present

## 2018-10-08 DIAGNOSIS — Z23 Encounter for immunization: Secondary | ICD-10-CM | POA: Diagnosis not present

## 2018-10-08 DIAGNOSIS — L309 Dermatitis, unspecified: Secondary | ICD-10-CM | POA: Diagnosis not present

## 2018-10-08 DIAGNOSIS — L570 Actinic keratosis: Secondary | ICD-10-CM | POA: Diagnosis not present

## 2018-10-08 DIAGNOSIS — L821 Other seborrheic keratosis: Secondary | ICD-10-CM | POA: Diagnosis not present

## 2018-11-21 DIAGNOSIS — H5203 Hypermetropia, bilateral: Secondary | ICD-10-CM | POA: Diagnosis not present

## 2019-01-04 DIAGNOSIS — Z13220 Encounter for screening for lipoid disorders: Secondary | ICD-10-CM | POA: Diagnosis not present

## 2019-01-04 DIAGNOSIS — E559 Vitamin D deficiency, unspecified: Secondary | ICD-10-CM | POA: Diagnosis not present

## 2019-01-04 DIAGNOSIS — Z7982 Long term (current) use of aspirin: Secondary | ICD-10-CM | POA: Diagnosis not present

## 2019-01-04 DIAGNOSIS — M810 Age-related osteoporosis without current pathological fracture: Secondary | ICD-10-CM | POA: Diagnosis not present

## 2019-01-04 DIAGNOSIS — I10 Essential (primary) hypertension: Secondary | ICD-10-CM | POA: Diagnosis not present

## 2019-01-04 DIAGNOSIS — I728 Aneurysm of other specified arteries: Secondary | ICD-10-CM | POA: Diagnosis not present

## 2019-01-04 DIAGNOSIS — D72819 Decreased white blood cell count, unspecified: Secondary | ICD-10-CM | POA: Diagnosis not present

## 2019-01-07 DIAGNOSIS — Z23 Encounter for immunization: Secondary | ICD-10-CM | POA: Diagnosis not present

## 2019-01-14 DIAGNOSIS — D72819 Decreased white blood cell count, unspecified: Secondary | ICD-10-CM | POA: Diagnosis not present

## 2019-01-14 DIAGNOSIS — J309 Allergic rhinitis, unspecified: Secondary | ICD-10-CM | POA: Diagnosis not present

## 2019-01-14 DIAGNOSIS — M419 Scoliosis, unspecified: Secondary | ICD-10-CM | POA: Diagnosis not present

## 2019-01-14 DIAGNOSIS — I499 Cardiac arrhythmia, unspecified: Secondary | ICD-10-CM | POA: Diagnosis not present

## 2019-01-14 DIAGNOSIS — Z0000 Encounter for general adult medical examination without abnormal findings: Secondary | ICD-10-CM | POA: Diagnosis not present

## 2019-01-14 DIAGNOSIS — I10 Essential (primary) hypertension: Secondary | ICD-10-CM | POA: Diagnosis not present

## 2019-01-14 DIAGNOSIS — G250 Essential tremor: Secondary | ICD-10-CM | POA: Diagnosis not present

## 2019-01-14 DIAGNOSIS — Z8679 Personal history of other diseases of the circulatory system: Secondary | ICD-10-CM | POA: Diagnosis not present

## 2019-01-14 DIAGNOSIS — M545 Low back pain: Secondary | ICD-10-CM | POA: Diagnosis not present

## 2019-01-14 DIAGNOSIS — I728 Aneurysm of other specified arteries: Secondary | ICD-10-CM | POA: Diagnosis not present

## 2019-01-21 DIAGNOSIS — I10 Essential (primary) hypertension: Secondary | ICD-10-CM | POA: Diagnosis not present

## 2019-01-21 DIAGNOSIS — M810 Age-related osteoporosis without current pathological fracture: Secondary | ICD-10-CM | POA: Diagnosis not present

## 2019-01-25 NOTE — Progress Notes (Signed)
    Subjective:   Katie Rubio, female    DOB: 01/26/1929, 85 y.o.   MRN: [id number]  Chief Complaint  Patient presents with  . Irregular Cardiac Rhythm    NP Eval     Patient referred by Pharr, Walter for irregular heart rhythm  HPI: Patient with splenic artery aneurysm, spinal stenosis, DDD, rheumatic fever at age 5, referred to us for evaluation of irregular heart rhythm.  She is essentially asymptomatic. Has occasional palpitations that she is not really bothered by. Denies any syncope or LOC. No chest pain or dyspnea on exertion. Has activity limiting back pain that keeps her from being very active.   Recently found to have hypertension. Not currently on medical therapy. She has been monitoring at home and states is averaging in the 120-130's systolic. No history of hyperlipidemia, diabetes, or thyroid disorders.   She continues to live at home with her husband.     Past Medical History:  Diagnosis Date  . Allergic rhinitis   . Atrophic vaginitis   . Dizziness   . History of anxiety   . History of rheumatic fever   . Macrocytosis    History of chronic macrocytosis  . Meralgia paresthetica    History of  . Osteoporosis   . Palpitations    occasional  . Post-menopausal   . Sclerosis of the skin    lichen  . Syncope   . Tinnitus    Chronic    Past Surgical History:  Procedure Laterality Date  . colon polyps      Family History  Problem Relation Age of Onset  . Heart attack Father   . Cancer - Colon Mother        colon    Social History   Socioeconomic History  . Marital status: Married    Spouse name: Not on file  . Number of children: 5  . Years of education: Not on file  . Highest education level: Not on file  Occupational History  . Not on file  Social Needs  . Financial resource strain: Not on file  . Food insecurity:    Worry: Not on file    Inability: Not on file  . Transportation needs:    Medical: Not on file   Non-medical: Not on file  Tobacco Use  . Smoking status: Former Smoker    Packs/day: 0.25    Types: Cigarettes  . Smokeless tobacco: Never Used  . Tobacco comment: Pt was a some day smoker.  Substance and Sexual Activity  . Alcohol use: No    Alcohol/week: 0.0 standard drinks  . Drug use: No  . Sexual activity: Not Currently    Partners: Male    Birth control/protection: Abstinence, Post-menopausal  Lifestyle  . Physical activity:    Days per week: Not on file    Minutes per session: Not on file  . Stress: Not on file  Relationships  . Social connections:    Talks on phone: Not on file    Gets together: Not on file    Attends religious service: Not on file    Active member of club or organization: Not on file    Attends meetings of clubs or organizations: Not on file    Relationship status: Not on file  . Intimate partner violence:    Fear of current or ex partner: Not on file    Emotionally abused: Not on file    Physically abused: Not on file      Forced sexual activity: Not on file  Other Topics Concern  . Not on file  Social History Narrative  . Not on file    Current Outpatient Medications on File Prior to Visit  Medication Sig Dispense Refill  . Cholecalciferol (VITAMIN D) 2000 UNITS CAPS Take 2,000 Units by mouth every other day.     . clobetasol cream (TEMOVATE) 0.05 % Apply topically as needed. 30 g 3  . Coenzyme Q10 (CO Q 10 PO) Take by mouth every other day.    . estradiol (ESTRACE) 0.1 MG/GM vaginal cream Place 1 g vaginally 2 (two) times a week. 42.5 g 3  . Flaxseed, Linseed, (FLAXSEED OIL) 1000 MG CAPS Take by mouth daily.    . magnesium 30 MG tablet Take 30 mg by mouth daily.    . Nutritional Supplements (JUICE PLUS FIBRE PO) Take by mouth daily.    . Omega-3 Fatty Acids (FISH OIL PO) Take by mouth daily.      No current facility-administered medications on file prior to visit.      Review of Systems  Constitution: Negative for decreased appetite,  malaise/fatigue, weight gain and weight loss.  Eyes: Negative for visual disturbance.  Cardiovascular: Negative for chest pain, claudication, dyspnea on exertion, leg swelling, orthopnea, palpitations and syncope.  Respiratory: Negative for hemoptysis and wheezing.   Endocrine: Negative for cold intolerance and heat intolerance.  Hematologic/Lymphatic: Does not bruise/bleed easily.  Skin: Negative for nail changes.  Musculoskeletal: Positive for back pain. Negative for falls, muscle weakness and myalgias.  Gastrointestinal: Negative for abdominal pain, change in bowel habit, nausea and vomiting.  Neurological: Negative for difficulty with concentration, dizziness, focal weakness and headaches.  Psychiatric/Behavioral: Negative for altered mental status and suicidal ideas.  All other systems reviewed and are negative.      Objective:     Blood pressure (!) 163/76, pulse 91, weight 115 lb 14.4 oz (52.6 kg), last menstrual period 11/14/1978, SpO2 98 %.  Physical Exam  Constitutional: She appears well-developed and well-nourished. No distress.  HENT:  Head: Atraumatic.  Eyes: Conjunctivae are normal.  Neck: Neck supple. No JVD present. No thyromegaly present.  Cardiovascular: Normal rate, regular rhythm, normal heart sounds and intact distal pulses.  Occasional extrasystoles are present. Exam reveals no gallop.  No murmur heard. Pulmonary/Chest: Effort normal and breath sounds normal.  Abdominal: Soft. Bowel sounds are normal.  Musculoskeletal: Normal range of motion.        General: No edema.  Neurological: She is alert.  Skin: Skin is warm and dry.  Psychiatric: She has a normal mood and affect.  Vitals reviewed.     Cardiac studies:   EKG 01/28/2019: Normal sinus rhythm at 81 bpm with frequent PAC's, left atrial enlargement, normal axis, no evidence of ischemia.   EKG PCP 01/14/2019: Atrial Arrhythmia.   Vascular US LE 05/12/2011: No evidence of hemodynamically significant  lower extremity arterial occlusive disease at rest  Recent Labs: 01/04/2019: Glucose 95, BUN/Cr 29/1.0, eGFR 54.4, Na/K 142/4.8, AST 25, ALT 23, Alk Phos 61. Rest of CMP normal.  H/H 11.6/36.9, MCV 96.9, Platelets 167, WBD 4.4, RBC 3.81, Rest of CBC normal.  Cholesterol 205, TG 48, HDL 73, calc LDL 122. Vitamin D25 41.    Assessment & Recommendations:   1. Premature atrial complex Noted to have frequent PAC's on EKG. Essentially asymptomatic. Do not feel that she needs further workup at this point especially given her age and lack of symptoms. Will continue to monitor.  2. Essential  hypertension Elevated today and was elevated at previous doctors appts. Suspect that she has underlying hypertension. She will continue to follow up with Dr. Pharr regarding this. She states BP is normal at home.  3. History of rheumatic fever At age 5. No known valvular disease.   Plan: Encouraged her to contact me for any new or worsening symptoms; otherwise, will continue with clinical surveillence.   Thank you for referring this patient. Please do not hesitate to contact me for any questions.    *I have discussed this case with Dr. Ganji and he personally examined the patient and participated in formulating the plan.*   Ashton Kelley, MSN, APRN, FNP-C [facility] Office: [phone] Fax: [phone] 

## 2019-01-28 ENCOUNTER — Ambulatory Visit: Payer: Medicare HMO | Admitting: Cardiology

## 2019-01-28 ENCOUNTER — Other Ambulatory Visit: Payer: Self-pay

## 2019-01-28 ENCOUNTER — Encounter: Payer: Self-pay | Admitting: Cardiology

## 2019-01-28 VITALS — BP 163/76 | HR 91 | Wt 115.9 lb

## 2019-01-28 DIAGNOSIS — I10 Essential (primary) hypertension: Secondary | ICD-10-CM | POA: Diagnosis not present

## 2019-01-28 DIAGNOSIS — R030 Elevated blood-pressure reading, without diagnosis of hypertension: Secondary | ICD-10-CM | POA: Insufficient documentation

## 2019-01-28 DIAGNOSIS — I491 Atrial premature depolarization: Secondary | ICD-10-CM

## 2019-01-28 DIAGNOSIS — Z8679 Personal history of other diseases of the circulatory system: Secondary | ICD-10-CM | POA: Diagnosis not present

## 2019-04-29 ENCOUNTER — Ambulatory Visit (INDEPENDENT_AMBULATORY_CARE_PROVIDER_SITE_OTHER): Payer: Medicare HMO | Admitting: Obstetrics and Gynecology

## 2019-04-29 ENCOUNTER — Ambulatory Visit: Payer: Medicare HMO | Admitting: Obstetrics and Gynecology

## 2019-04-29 ENCOUNTER — Encounter: Payer: Self-pay | Admitting: Obstetrics and Gynecology

## 2019-04-29 ENCOUNTER — Other Ambulatory Visit: Payer: Self-pay

## 2019-04-29 VITALS — BP 140/90 | HR 86 | Temp 97.9°F | Ht 61.5 in | Wt 115.0 lb

## 2019-04-29 DIAGNOSIS — M810 Age-related osteoporosis without current pathological fracture: Secondary | ICD-10-CM

## 2019-04-29 DIAGNOSIS — N3941 Urge incontinence: Secondary | ICD-10-CM

## 2019-04-29 DIAGNOSIS — L900 Lichen sclerosus et atrophicus: Secondary | ICD-10-CM

## 2019-04-29 DIAGNOSIS — Z01419 Encounter for gynecological examination (general) (routine) without abnormal findings: Secondary | ICD-10-CM | POA: Diagnosis not present

## 2019-04-29 DIAGNOSIS — L309 Dermatitis, unspecified: Secondary | ICD-10-CM

## 2019-04-29 MED ORDER — CLOBETASOL PROPIONATE 0.05 % EX OINT: TOPICAL_OINTMENT | CUTANEOUS | 1 refills | Status: DC

## 2019-04-29 NOTE — Patient Instructions (Signed)
EXERCISE AND DIET:  We recommended that you start or continue a regular exercise program for good health. Regular exercise means any activity that makes your heart beat faster and makes you sweat.  We recommend exercising at least 30 minutes per day at least 3 days a week, preferably 4 or 5.  We also recommend a diet low in fat and sugar.  Inactivity, poor dietary choices and obesity can cause diabetes, heart attack, stroke, and kidney damage, among others.    ALCOHOL AND SMOKING:  Women should limit their alcohol intake to no more than 7 drinks/beers/glasses of wine (combined, not each!) per week. Moderation of alcohol intake to this level decreases your risk of breast cancer and liver damage. And of course, no recreational drugs are part of a healthy lifestyle.  And absolutely no smoking or even second hand smoke. Most people know smoking can cause heart and lung diseases, but did you know it also contributes to weakening of your bones? Aging of your skin?  Yellowing of your teeth and nails?  CALCIUM AND VITAMIN D:  Adequate intake of calcium and Vitamin D are recommended.  The recommendations for exact amounts of these supplements seem to change often, but generally speaking 1,200 mg of calcium (between diet and supplement) and 800 units of Vitamin D per day seems prudent. Certain women may benefit from higher intake of Vitamin D.  If you are among these women, your doctor will have told you during your visit.    PAP SMEARS:  Pap smears, to check for cervical cancer or precancers,  have traditionally been done yearly, although recent scientific advances have shown that most women can have pap smears less often.  However, every woman still should have a physical exam from her gynecologist every year. It will include a breast check, inspection of the vulva and vagina to check for abnormal growths or skin changes, a visual exam of the cervix, and then an exam to evaluate the size and shape of the uterus and  ovaries.  And after 83 years of age, a rectal exam is indicated to check for rectal cancers. We will also provide age appropriate advice regarding health maintenance, like when you should have certain vaccines, screening for sexually transmitted diseases, bone density testing, colonoscopy, mammograms, etc.   MAMMOGRAMS:  All women over 40 years old should have a yearly mammogram. Many facilities now offer a "3D" mammogram, which may cost around $50 extra out of pocket. If possible,  we recommend you accept the option to have the 3D mammogram performed.  It both reduces the number of women who will be called back for extra views which then turn out to be normal, and it is better than the routine mammogram at detecting truly abnormal areas.    COLON CANCER SCREENING: Now recommend starting at age 45. At this time colonoscopy is not covered for routine screening until 50. There are take home tests that can be done between 45-49.   COLONOSCOPY:  Colonoscopy to screen for colon cancer is recommended for all women at age 50.  We know, you hate the idea of the prep.  We agree, BUT, having colon cancer and not knowing it is worse!!  Colon cancer so often starts as a polyp that can be seen and removed at colonscopy, which can quite literally save your life!  And if your first colonoscopy is normal and you have no family history of colon cancer, most women don't have to have it again for   10 years.  Once every ten years, you can do something that may end up saving your life, right?  We will be happy to help you get it scheduled when you are ready.  Be sure to check your insurance coverage so you understand how much it will cost.  It may be covered as a preventative service at no cost, but you should check your particular policy.      Breast Self-Awareness Breast self-awareness means being familiar with how your breasts look and feel. It involves checking your breasts regularly and reporting any changes to your  health care provider. Practicing breast self-awareness is important. A change in your breasts can be a sign of a serious medical problem. Being familiar with how your breasts look and feel allows you to find any problems early, when treatment is more likely to be successful. All women should practice breast self-awareness, including women who have had breast implants. How to do a breast self-exam One way to learn what is normal for your breasts and whether your breasts are changing is to do a breast self-exam. To do a breast self-exam: Look for Changes  1. Remove all the clothing above your waist. 2. Stand in front of a mirror in a room with good lighting. 3. Put your hands on your hips. 4. Push your hands firmly downward. 5. Compare your breasts in the mirror. Look for differences between them (asymmetry), such as: ? Differences in shape. ? Differences in size. ? Puckers, dips, and bumps in one breast and not the other. 6. Look at each breast for changes in your skin, such as: ? Redness. ? Scaly areas. 7. Look for changes in your nipples, such as: ? Discharge. ? Bleeding. ? Dimpling. ? Redness. ? A change in position. Feel for Changes Carefully feel your breasts for lumps and changes. It is best to do this while lying on your back on the floor and again while sitting or standing in the shower or tub with soapy water on your skin. Feel each breast in the following way:  Place the arm on the side of the breast you are examining above your head.  Feel your breast with the other hand.  Start in the nipple area and make  inch (2 cm) overlapping circles to feel your breast. Use the pads of your three middle fingers to do this. Apply light pressure, then medium pressure, then firm pressure. The light pressure will allow you to feel the tissue closest to the skin. The medium pressure will allow you to feel the tissue that is a little deeper. The firm pressure will allow you to feel the tissue  close to the ribs.  Continue the overlapping circles, moving downward over the breast until you feel your ribs below your breast.  Move one finger-width toward the center of the body. Continue to use the  inch (2 cm) overlapping circles to feel your breast as you move slowly up toward your collarbone.  Continue the up and down exam using all three pressures until you reach your armpit.  Write Down What You Find  Write down what is normal for each breast and any changes that you find. Keep a written record with breast changes or normal findings for each breast. By writing this information down, you do not need to depend only on memory for size, tenderness, or location. Write down where you are in your menstrual cycle, if you are still menstruating. If you are having trouble noticing differences   in your breasts, do not get discouraged. With time you will become more familiar with the variations in your breasts and more comfortable with the exam. How often should I examine my breasts? Examine your breasts every month. If you are breastfeeding, the best time to examine your breasts is after a feeding or after using a breast pump. If you menstruate, the best time to examine your breasts is 5-7 days after your period is over. During your period, your breasts are lumpier, and it may be more difficult to notice changes. When should I see my health care provider? See your health care provider if you notice:  A change in shape or size of your breasts or nipples.  A change in the skin of your breast or nipples, such as a reddened or scaly area.  Unusual discharge from your nipples.  A lump or thick area that was not there before.  Pain in your breasts.  Anything that concerns you.  

## 2019-04-29 NOTE — Progress Notes (Signed)
85 y.o. [id number] Married White or Caucasian Not Hispanic or Latino female here for annual exam.   No vaginal bleeding.  H/O lichen sclerosis, occasionally itchy.   She is on estrace cream, she is willing to try going off. Not sexually active.   She has some urge incontinence at night. Wears a pad at night. Leaks small amounts. Occasionally she isn't up at night. Typically up 2 x at night. Previously went to PT which helped. She knows the exercises.   She has issues with constipation. Takes psyllium, prunes. She has to strain at times. She has a h/o perianal dermatitis. Feels sore in that area, anus is itchy. She is using the Vaseline regularly.  She is having a BM on average every 2 days, she is straining. It does get better if she eats more fruit.  She has chronic back aching. She has seen an Orthopedic MD, arthritis, degenerative disc disease.  H/O osteoporosis, has spoken with her primary about treatment options.  She does work in the yard.   Her husband uses a walker, he is very weak.   Her sister died in 2022-12-10. Her only sibling.   Her son in law has stage 4 bladder cancer. Being seen at [facility]. He is lucky to be on a drug trial.      Her primary is following her BP, not on medication at this time.  Patient's last menstrual period was 11/14/1978 (approximate).          Sexually active: No.  The current method of family planning is post menopausal status.    Exercising: Yes.    walking Smoker:  no  Health Maintenance: Pap: 04-06-15 Neg, 03-23-11 Neg History of abnormal Pap:  no MMG:  09/2008 3D/Density B/Neg/BiRads1, discussed screening Colonoscopy:  01/2011 polyps;--PCP states no longer needed. BMD:  02/2018 with Dr.Pharr--abnormal and suggested treatment--she has appointment to discuss TDaP:  11/14/2013 Gardasil: no   reports that she has quit smoking. Her smoking use included cigarettes. She smoked 0.25 packs per day. She has never used smokeless tobacco. She reports that she  does not drink alcohol or use drugs. Had 5 children, son died at 62. Has 4 living children, 8 grand children and 4 great grand children (another one on the way).  Past Medical History:  Diagnosis Date  . Allergic rhinitis   . Atrophic vaginitis   . Dizziness   . History of anxiety   . History of rheumatic fever   . Macrocytosis    History of chronic macrocytosis  . Meralgia paresthetica    History of  . Osteoporosis   . Palpitations    occasional  . Post-menopausal   . Sclerosis of the skin    lichen  . Syncope   . Tinnitus    Chronic    Past Surgical History:  Procedure Laterality Date  . colon polyps      Current Outpatient Medications  Medication Sig Dispense Refill  . Cholecalciferol (VITAMIN D) 2000 UNITS CAPS Take 2,000 Units by mouth every other day.     . clobetasol cream (TEMOVATE) 0.05 % Apply topically as needed. 30 g 3  . Coenzyme Q10 (CO Q 10 PO) Take by mouth every other day.    . estradiol (ESTRACE) 0.1 MG/GM vaginal cream Place 1 g vaginally 2 (two) times a week. 42.5 g 3  . Flaxseed, Linseed, (FLAXSEED OIL) 1000 MG CAPS Take by mouth daily.    . magnesium 30 MG tablet Take 30 mg by mouth daily.    Marland Kitchen  Nutritional Supplements (JUICE PLUS FIBRE PO) Take by mouth daily.    . Omega-3 Fatty Acids (FISH OIL PO) Take by mouth daily.      No current facility-administered medications for this visit.     Family History  Problem Relation Age of Onset  . Heart attack Father   . Cancer - Colon Mother        colon    Review of Systems  Constitutional: Negative.   HENT: Negative.   Eyes: Negative.   Respiratory: Negative.   Cardiovascular: Negative.   Gastrointestinal: Positive for constipation.  Endocrine: Negative.   Genitourinary:       Vaginal lump  Musculoskeletal: Negative.   Skin: Negative.   Allergic/Immunologic: Negative.   Neurological: Negative.   Hematological: Negative.   Psychiatric/Behavioral: Negative.   Lump inside right side of her  vagina.   Exam:   BP 140/90 (BP Location: Left Arm, Patient Position: Sitting, Cuff Size: Normal)   Pulse 86   Temp 97.9 F (36.6 C) (Skin)   Ht 5' 1.5" (1.562 m)   Wt 115 lb (52.2 kg)   LMP 11/14/1978 (Approximate)   BMI 21.38 kg/m   Weight change: @WEIGHTCHANGE @ Height:   Height: 5' 1.5" (156.2 cm)  Ht Readings from Last 3 Encounters:  04/29/19 5' 1.5" (1.562 m)  04/18/18 5\' 2"  (1.575 m)  04/17/17 5' 2.5" (1.588 m)    General appearance: alert, cooperative and appears stated age Head: Normocephalic, without obvious abnormality, atraumatic Neck: no adenopathy, supple, symmetrical, trachea midline and thyroid normal to inspection and palpation Lungs: clear to auscultation bilaterally Cardiovascular: regular rate and rhythm Breasts: normal appearance, no masses or tenderness Abdomen: soft, non-tender; non distended,  no masses,  no organomegaly Extremities: extremities normal, atraumatic, no cyanosis or edema Skin: Skin color, texture, turgor normal. No rashes or lesions Lymph nodes: Cervical, supraclavicular, and axillary nodes normal. No abnormal inguinal nodes palpated Neurologic: Grossly normal   Pelvic: External genitalia:  Atrophic, mild whitening and agglutination.              Urethra:  normal appearing urethra with no masses, tenderness or lesions              Bartholins and Skenes: normal                 Vagina: very atrophic appearing vagina with normal color and discharge, no lesions  No lump identified, patient couldn't find it.               Cervix: no lesions               Bimanual Exam:  Uterus:  normal size, contour, position, consistency, mobility, non-tender              Adnexa: no mass, fullness, tenderness               Rectovaginal: Confirms               Anus:  normal sphincter tone, no lesions  Perianal region: whitening, erythema, c/w lichen simplex chronicus. Small amount of feces noted on skin (patient denies staining)  Chaperone was present for  exam.  A:  Well Woman with normal exam  Lichen sclerosis  Perianal dermatitis  Constipation  Mild urge incontinence, nocturia, stable  Osteoporosis  P:   No pap needed  No mammogram needed  No colonoscopy recommended  She will f/u with her primary for osteoporosis  Discussed breast self exam  Discussed calcium and vit  D intake  Will try going off the estrogen cream  Use Vaseline to the vulvar and perianal skin  PRN use of steroid ointment

## 2019-05-30 ENCOUNTER — Ambulatory Visit: Payer: Medicare HMO | Admitting: Obstetrics and Gynecology

## 2019-06-01 ENCOUNTER — Other Ambulatory Visit: Payer: Self-pay | Admitting: Internal Medicine

## 2019-06-04 ENCOUNTER — Other Ambulatory Visit: Payer: Self-pay | Admitting: Internal Medicine

## 2019-06-04 DIAGNOSIS — K5900 Constipation, unspecified: Secondary | ICD-10-CM

## 2019-06-19 ENCOUNTER — Inpatient Hospital Stay: Admission: RE | Admit: 2019-06-19 | Payer: Self-pay | Source: Ambulatory Visit

## 2019-06-27 ENCOUNTER — Ambulatory Visit: Payer: Self-pay

## 2019-07-05 ENCOUNTER — Ambulatory Visit: Payer: Self-pay

## 2019-07-31 ENCOUNTER — Encounter: Payer: Self-pay | Admitting: Cardiology

## 2019-07-31 ENCOUNTER — Other Ambulatory Visit: Payer: Self-pay

## 2019-07-31 ENCOUNTER — Ambulatory Visit: Payer: Medicare HMO | Admitting: Cardiology

## 2019-07-31 VITALS — BP 182/84 | HR 87 | Temp 97.5°F | Ht 61.0 in | Wt 114.0 lb

## 2019-07-31 DIAGNOSIS — Z8679 Personal history of other diseases of the circulatory system: Secondary | ICD-10-CM | POA: Diagnosis not present

## 2019-07-31 DIAGNOSIS — I10 Essential (primary) hypertension: Secondary | ICD-10-CM | POA: Diagnosis not present

## 2019-07-31 DIAGNOSIS — I491 Atrial premature depolarization: Secondary | ICD-10-CM | POA: Diagnosis not present

## 2019-07-31 NOTE — Progress Notes (Signed)
Primary Physician:  Deland Pretty, MD   Patient ID: Katie Rubio, female    DOB: 09/15/1929, 85 y.o.   MRN: [id number]  Subjective:    Chief Complaint  Patient presents with  . Hypertension  . PAC  . Follow-up    6 month    HPI: Katie Rubio  is a 85 y.o. female  with splenic artery aneurysm, spinal stenosis, DDD, rheumatic fever at age 24, last evaluated by [place] 6 months ago for PAC's. She did not undergo testing in view of lack of symptoms.   Presently doing well, continues to be essentially asymptomatic. Denies any syncope or LOC. No chest pain or dyspnea on exertion. Has activity limiting back pain that keeps her from being very active.   She has history of elevated blood pressures at physician offices, but generally stable at home. Not currently on medical therapy. She has been monitoring at home and states is averaging in the 409-811'B systolic. She is hoping to make an appt with Dr. Shelia Media soon to further discuss this also. No history of hyperlipidemia, diabetes, or thyroid disorders.   She continues to live at home with her husband, and does most of her household chores.     Past Medical History:  Diagnosis Date  . Allergic rhinitis   . Atrophic vaginitis   . Dizziness   . History of anxiety   . History of rheumatic fever   . Macrocytosis    History of chronic macrocytosis  . Meralgia paresthetica    History of  . Osteoporosis   . Palpitations    occasional  . Post-menopausal   . Sclerosis of the skin    lichen  . Syncope   . Tinnitus    Chronic    Past Surgical History:  Procedure Laterality Date  . colon polyps      Social History   Socioeconomic History  . Marital status: Married    Spouse name: Not on file  . Number of children: 5  . Years of education: Not on file  . Highest education level: Not on file  Occupational History  . Not on file  Social Needs  . Financial resource strain: Not on file  . Food insecurity    Worry:  Not on file    Inability: Not on file  . Transportation needs    Medical: Not on file    Non-medical: Not on file  Tobacco Use  . Smoking status: Former Smoker    Packs/day: 0.25    Years: 5.00    Pack years: 1.25    Types: Cigarettes    Quit date: 1968    Years since quitting: 52.7  . Smokeless tobacco: Never Used  . Tobacco comment: Pt was a some day smoker.  Substance and Sexual Activity  . Alcohol use: No    Alcohol/week: 0.0 standard drinks  . Drug use: No  . Sexual activity: Not Currently    Partners: Male    Birth control/protection: Abstinence, Post-menopausal  Lifestyle  . Physical activity    Days per week: Not on file    Minutes per session: Not on file  . Stress: Not on file  Relationships  . Social Herbalist on phone: Not on file    Gets together: Not on file    Attends religious service: Not on file    Active member of club or organization: Not on file    Attends meetings of clubs or organizations: Not  on file    Relationship status: Not on file  . Intimate partner violence    Fear of current or ex partner: Not on file    Emotionally abused: Not on file    Physically abused: Not on file    Forced sexual activity: Not on file  Other Topics Concern  . Not on file  Social History Narrative  . Not on file    Review of Systems  Constitution: Negative for decreased appetite, malaise/fatigue, weight gain and weight loss.  Eyes: Negative for visual disturbance.  Cardiovascular: Negative for chest pain, claudication, dyspnea on exertion, leg swelling, orthopnea, palpitations and syncope.  Respiratory: Negative for hemoptysis and wheezing.   Endocrine: Negative for cold intolerance and heat intolerance.  Hematologic/Lymphatic: Does not bruise/bleed easily.  Skin: Negative for nail changes.  Musculoskeletal: Positive for back pain. Negative for falls, muscle weakness and myalgias.  Gastrointestinal: Negative for abdominal pain, change in bowel  habit, nausea and vomiting.  Neurological: Negative for difficulty with concentration, dizziness, focal weakness and headaches.  Psychiatric/Behavioral: Negative for altered mental status and suicidal ideas.  All other systems reviewed and are negative.     Objective:  Blood pressure (!) 182/84, pulse 87, temperature (!) 97.5 F (36.4 C), height _0  (1.549 m), weight 114 lb (51.7 kg), last menstrual period 11/14/1978, SpO2 96 %. Body mass index is 21.54 kg/m.    Physical Exam  Constitutional: She is oriented to person, place, and time. Vital signs are normal. She appears well-developed and well-nourished.  HENT:  Head: Normocephalic and atraumatic.  Neck: Normal range of motion.  Cardiovascular: Normal rate, regular rhythm, normal heart sounds and intact distal pulses.  Occasional extrasystoles are present.  Pulmonary/Chest: Effort normal and breath sounds normal. No accessory muscle usage. No respiratory distress.  Abdominal: Soft. Bowel sounds are normal.  Musculoskeletal: Normal range of motion.  Neurological: She is alert and oriented to person, place, and time.  Skin: Skin is warm and dry.  Vitals reviewed.  Radiology: No results found.  Laboratory examination:   01/04/2019: Glucose 95, BUN/Cr 29/1.0, eGFR 54.4, Na/K 142/4.8, AST 25, ALT 23, Alk Phos 61. Rest of CMP normal.  H/H 11.6/36.9, MCV 96.9, Platelets 167, WBD 4.4, RBC 3.81, Rest of CBC normal.  Cholesterol 205, TG 48, HDL 73, calc LDL 122. Vitamin D25 41.  No flowsheet data found. No flowsheet data found. Lipid Panel  No results found for: CHOL, TRIG, HDL, CHOLHDL, VLDL, LDLCALC, LDLDIRECT HEMOGLOBIN A1C No results found for: HGBA1C, MPG TSH No results for input(s): TSH in the last 8760 hours.  PRN Meds:. There are no discontinued medications. Current Meds  Medication Sig  . Cholecalciferol (VITAMIN D) 2000 UNITS CAPS Take 2,000 Units by mouth every other day.   . clobetasol ointment (TEMOVATE) 0.05 %  Apply a small amount topically BID for up to 2 weeks prn. Can use the cream 2 x a week baseline as needed. Not for daily long term use.  . Coenzyme Q10 (CO Q 10 PO) Take by mouth every other day.  . Flaxseed, Linseed, (FLAXSEED OIL) 1000 MG CAPS Take by mouth daily.  . magnesium 30 MG tablet Take 30 mg by mouth daily.  . Nutritional Supplements (JUICE PLUS FIBRE PO) Take by mouth daily.  . Omega-3 Fatty Acids (FISH OIL PO) Take by mouth daily.     Cardiac Studies:   Vascular US LE 05/12/2011: No evidence of hemodynamically significant lower extremity arterial occlusive disease at rest  Assessment:  Premature atrial complex  Essential hypertension - Plan: EKG 12-Lead  EKG 07/31/2019: Normal sinus rhythm at 81 bpm with frequent PAC's, left atrial enlargement, normal axis, no evidence of ischemia. No changes from EKG 01/28/2019.  EKG PCP 01/14/2019: Atrial Arrhythmia.   Recommendations:   Patient is here for 17-monthfollow-up for PACs.  She is presently doing well, remains asymptomatic in regards to her PACs.  Her blood pressure is again elevated in our office, but she monitors regularly at home and is always very well controlled and is only elevated at physician appts.  She is wanting to discuss this with Dr. PShelia Mediafor and has also other questions that she would like to discuss with him; therefore, I have not added any medications at this time.    As she is asymptomatic, I have not recommended further cardiac testing at this point especially in view of her advanced age. Has a history of rheumatic fever as a child, no evidence of valvular disease by physical exam. She is still able to maintain her house without any exertional difficulty.  She does suffer from back pain, encouraged her to also discuss with her PCP regarding physical therapy to help with strengthening her back at her upcoming appt.  I will see her back in 1 year or sooner if problems.  AMiquel Dunn MSN, APRN, FNP-C  [facility] IncCardiovascular. PDawsonOffice: [phone]: [phone]

## 2019-08-15 DIAGNOSIS — R1013 Epigastric pain: Secondary | ICD-10-CM | POA: Diagnosis not present

## 2019-08-15 DIAGNOSIS — E1140 Type 2 diabetes mellitus with diabetic neuropathy, unspecified: Secondary | ICD-10-CM | POA: Diagnosis not present

## 2019-08-15 DIAGNOSIS — R69 Illness, unspecified: Secondary | ICD-10-CM | POA: Diagnosis not present

## 2019-08-26 DIAGNOSIS — Z87891 Personal history of nicotine dependence: Secondary | ICD-10-CM | POA: Diagnosis not present

## 2019-08-26 DIAGNOSIS — K5900 Constipation, unspecified: Secondary | ICD-10-CM | POA: Diagnosis not present

## 2019-08-26 DIAGNOSIS — Z8249 Family history of ischemic heart disease and other diseases of the circulatory system: Secondary | ICD-10-CM | POA: Diagnosis not present

## 2019-08-26 DIAGNOSIS — R030 Elevated blood-pressure reading, without diagnosis of hypertension: Secondary | ICD-10-CM | POA: Diagnosis not present

## 2019-08-26 DIAGNOSIS — R32 Unspecified urinary incontinence: Secondary | ICD-10-CM | POA: Diagnosis not present

## 2019-08-26 DIAGNOSIS — Z809 Family history of malignant neoplasm, unspecified: Secondary | ICD-10-CM | POA: Diagnosis not present

## 2019-08-26 DIAGNOSIS — M810 Age-related osteoporosis without current pathological fracture: Secondary | ICD-10-CM | POA: Diagnosis not present

## 2019-09-02 ENCOUNTER — Other Ambulatory Visit: Payer: Self-pay | Admitting: *Deleted

## 2019-09-02 DIAGNOSIS — Z20828 Contact with and (suspected) exposure to other viral communicable diseases: Secondary | ICD-10-CM | POA: Diagnosis not present

## 2019-09-02 DIAGNOSIS — Z20822 Contact with and (suspected) exposure to covid-19: Secondary | ICD-10-CM

## 2019-09-04 LAB — NOVEL CORONAVIRUS, NAA: SARS-CoV-2, NAA: NOT DETECTED

## 2019-09-19 DIAGNOSIS — M25511 Pain in right shoulder: Secondary | ICD-10-CM | POA: Diagnosis not present

## 2019-09-19 DIAGNOSIS — K5900 Constipation, unspecified: Secondary | ICD-10-CM | POA: Diagnosis not present

## 2019-09-24 DIAGNOSIS — E1140 Type 2 diabetes mellitus with diabetic neuropathy, unspecified: Secondary | ICD-10-CM | POA: Diagnosis not present

## 2019-09-24 DIAGNOSIS — R1013 Epigastric pain: Secondary | ICD-10-CM | POA: Diagnosis not present

## 2019-09-24 DIAGNOSIS — R69 Illness, unspecified: Secondary | ICD-10-CM | POA: Diagnosis not present

## 2019-09-26 DIAGNOSIS — Z0130 Encounter for examination of blood pressure without abnormal findings: Secondary | ICD-10-CM | POA: Diagnosis not present

## 2019-09-26 DIAGNOSIS — Z23 Encounter for immunization: Secondary | ICD-10-CM | POA: Diagnosis not present

## 2019-09-26 DIAGNOSIS — M810 Age-related osteoporosis without current pathological fracture: Secondary | ICD-10-CM | POA: Diagnosis not present

## 2019-11-06 ENCOUNTER — Telehealth: Payer: Self-pay

## 2019-11-06 NOTE — Telephone Encounter (Signed)
Pt called and wanted to let you know that lately she has been lightheaded sometimes but it goes away quick; She does have issues with vertigo and I told her to reach out to PCP as well; She just wants to know if we have any advice; This doesn't happen daily only sometimes and she knows your out of town so I told her ill send you the msg and get back with her when you get back

## 2019-11-12 NOTE — Telephone Encounter (Signed)
She says it doesn't happen often; she is going to make an appt with her eye doctor and call us after

## 2019-11-12 NOTE — Telephone Encounter (Signed)
Difficult to determine the etiology. She does have frequent PAC's, we can place her on event monitor to see if she is having any arrhythmias that may be contributing. How often is this happening?

## 2019-11-19 DIAGNOSIS — H5203 Hypermetropia, bilateral: Secondary | ICD-10-CM | POA: Diagnosis not present

## 2019-11-21 DIAGNOSIS — M79675 Pain in left toe(s): Secondary | ICD-10-CM | POA: Diagnosis not present

## 2019-11-21 DIAGNOSIS — M21962 Unspecified acquired deformity of left lower leg: Secondary | ICD-10-CM | POA: Diagnosis not present

## 2019-11-21 DIAGNOSIS — M2042 Other hammer toe(s) (acquired), left foot: Secondary | ICD-10-CM | POA: Diagnosis not present

## 2019-12-04 NOTE — Progress Notes (Signed)
Primary Physician:  Deland Pretty, MD   Patient ID: Katie Rubio, female    DOB: 1928/12/09, 86 y.o.   MRN: [id number]  Subjective:    Chief Complaint  Patient presents with  . premature atrial complex    HPI: Katie Rubio  is a 86 y.o. female  with splenic artery aneurysm, spinal stenosis, DDD, rheumatic fever at age 81, last evaluated by [place] 6 months ago for PAC's. She did not undergo testing in view of lack of symptoms.   She made an appointment to see me today due to episodes of dizziness that are brief. She reports 3 brief episodes that seemed to have occurred with turning her head a certain way or certain positions. She does have occasional palpitations, but unsure if these occur during these episodes. First episode occurred after laying in dentist chair for a few hours, 2nd episode occurred suddenly while standing at the sink, 3rd episode occurred while making the bed. Denies any syncope or LOC. No chest pain or dyspnea on exertion. Has activity limiting back pain that keeps her from being very active.   She has history of elevated blood pressures at physician offices, but generally stable at home. Not currently on medical therapy. She has been monitoring at home and states is averaging in the 374-827'M systolic. She is planning to potentially make an appointment with Dr. Shelia Media to discuss therapy. No history of hyperlipidemia, diabetes, or thyroid disorders.   She continues to live at home with her husband, and does most of her household chores.     Past Medical History:  Diagnosis Date  . Allergic rhinitis   . Atrophic vaginitis   . Dizziness   . History of anxiety   . History of rheumatic fever   . Macrocytosis    History of chronic macrocytosis  . Meralgia paresthetica    History of  . Osteoporosis   . Palpitations    occasional  . Post-menopausal   . Sclerosis of the skin    lichen  . Syncope   . Tinnitus    Chronic    Past Surgical History:   Procedure Laterality Date  . colon polyps      Social History   Socioeconomic History  . Marital status: Married    Spouse name: Not on file  . Number of children: 5  . Years of education: Not on file  . Highest education level: Not on file  Occupational History  . Not on file  Tobacco Use  . Smoking status: Former Smoker    Packs/day: 0.25    Years: 5.00    Pack years: 1.25    Types: Cigarettes    Quit date: 1968    Years since quitting: 53.0  . Smokeless tobacco: Never Used  . Tobacco comment: Pt was a some day smoker.  Substance and Sexual Activity  . Alcohol use: No    Alcohol/week: 0.0 standard drinks  . Drug use: No  . Sexual activity: Not Currently    Partners: Male    Birth control/protection: Abstinence, Post-menopausal  Other Topics Concern  . Not on file  Social History Narrative  . Not on file   Social Determinants of Health   Financial Resource Strain:   . Difficulty of Paying Living Expenses: Not on file  Food Insecurity:   . Worried About Charity fundraiser in the Last Year: Not on file  . Ran Out of Food in the Last Year: Not on file  Transportation Needs:   . Film/video editor (Medical): Not on file  . Lack of Transportation (Non-Medical): Not on file  Physical Activity:   . Days of Exercise per Week: Not on file  . Minutes of Exercise per Session: Not on file  Stress:   . Feeling of Stress : Not on file  Social Connections:   . Frequency of Communication with Friends and Family: Not on file  . Frequency of Social Gatherings with Friends and Family: Not on file  . Attends Religious Services: Not on file  . Active Member of Clubs or Organizations: Not on file  . Attends Archivist Meetings: Not on file  . Marital Status: Not on file  Intimate Partner Violence:   . Fear of Current or Ex-Partner: Not on file  . Emotionally Abused: Not on file  . Physically Abused: Not on file  . Sexually Abused: Not on file    Review of  Systems  Constitution: Negative for decreased appetite, malaise/fatigue, weight gain and weight loss.  Eyes: Negative for visual disturbance.  Cardiovascular: Positive for palpitations. Negative for chest pain, claudication, dyspnea on exertion, leg swelling, orthopnea and syncope.  Respiratory: Negative for hemoptysis and wheezing.   Endocrine: Negative for cold intolerance and heat intolerance.  Hematologic/Lymphatic: Does not bruise/bleed easily.  Skin: Negative for nail changes.  Musculoskeletal: Positive for back pain. Negative for falls, muscle weakness and myalgias.  Gastrointestinal: Negative for abdominal pain, change in bowel habit, nausea and vomiting.  Neurological: Positive for dizziness. Negative for difficulty with concentration, focal weakness and headaches.  Psychiatric/Behavioral: Negative for altered mental status and suicidal ideas.  All other systems reviewed and are negative.     Objective:  Blood pressure (!) 176/91, pulse 95, temperature 97.8 F (36.6 C), temperature source Temporal, resp. rate 16, height 5' 1"  (1.549 m), weight 119 lb 4.8 oz (54.1 kg), last menstrual period 11/14/1978, SpO2 97 %. Body mass index is 22.54 kg/m.    Physical Exam  Constitutional: She is oriented to person, place, and time. Vital signs are normal. She appears well-developed and well-nourished.  HENT:  Head: Normocephalic and atraumatic.  Cardiovascular: Normal rate, regular rhythm, normal heart sounds and intact distal pulses.  Occasional extrasystoles are present.  Pulmonary/Chest: Effort normal and breath sounds normal. No accessory muscle usage. No respiratory distress.  Abdominal: Soft. Bowel sounds are normal.  Musculoskeletal:        General: Normal range of motion.     Cervical back: Normal range of motion.  Neurological: She is alert and oriented to person, place, and time.  Skin: Skin is warm and dry.  Vitals reviewed.  Radiology: No results found.  Laboratory  examination:   01/04/2019: Glucose 95, BUN/Cr 29/1.0, eGFR 54.4, Na/K 142/4.8, AST 25, ALT 23, Alk Phos 61. Rest of CMP normal.  H/H 11.6/36.9, MCV 96.9, Platelets 167, WBD 4.4, RBC 3.81, Rest of CBC normal.  Cholesterol 205, TG 48, HDL 73, calc LDL 122. Vitamin D25 41.  No flowsheet data found. No flowsheet data found. Lipid Panel  No results found for: CHOL, TRIG, HDL, CHOLHDL, VLDL, LDLCALC, LDLDIRECT HEMOGLOBIN A1C No results found for: HGBA1C, MPG TSH No results for input(s): TSH in the last 8760 hours.  PRN Meds:. There are no discontinued medications. Current Meds  Medication Sig  . Cholecalciferol (VITAMIN D) 2000 UNITS CAPS Take 2,000 Units by mouth every other day.   . clobetasol ointment (TEMOVATE) 0.05 % Apply a small amount topically BID for up to  2 weeks prn. Can use the cream 2 x a week baseline as needed. Not for daily long term use.  . Coenzyme Q10 (CO Q 10 PO) Take by mouth every other day.  . Cyanocobalamin (VITAMIN B 12 PO) Take 1 tablet by mouth daily.  . Flaxseed, Linseed, (FLAXSEED OIL) 1000 MG CAPS Take by mouth daily.  . magnesium 30 MG tablet Take 30 mg by mouth daily.  . Nutritional Supplements (JUICE PLUS FIBRE PO) Take by mouth daily.  . Omega-3 Fatty Acids (FISH OIL PO) Take by mouth daily.     Cardiac Studies:   Vascular US LE 05/12/2011: No evidence of hemodynamically significant lower extremity arterial occlusive disease at rest  Assessment:     ICD-10-CM   1. Premature atrial complex  I49.1   2. Dizziness  R42   3. Essential hypertension  I10   4. History of rheumatic fever  Z86.79     EKG 12/05/2019: Normal sinus rhythm/arrhythmia at 85 bpm with 1 PAC's, left atrial enlargement, normal axis, no evidence of ischemia. No changes from EKG 07/31/2019.  EKG PCP 01/14/2019: Atrial Arrhythmia.   Recommendations:   Patient made an appointment to see me today for episodes of dizziness and palpitations. Her episodes of dizziness are fairly  brief, without syncope. I feel that her symptoms are likely consistent with BPV, in which she has a history. She may benefit from ENT evaluation given her history of this. She does have occasional PAC's. Given her intermittent dizziness and PAC's, will place her on 30 day event monitor to evaluate for arrhythmia etiology for her symptoms. I will also obtain echocardiogram to exclude any structural abnormalities. No clinical evidence of heart failure.  Blood pressure continues to be elevated, although she states is normal at home, it has been high at her last few appointments with [place]. I would recommend starting medical therapy for this. She wishes to discuss with Dr. Shelia Media prior to starting medication. Encouraged her to make an appointment to discuss this. I will plan to see her back after the event monitor and echocardiogram for follow up.  Miquel Dunn, MSN, APRN, FNP-C [facility]. [place] Office: [phone] Fax: [phone]

## 2019-12-05 ENCOUNTER — Other Ambulatory Visit: Payer: Self-pay

## 2019-12-05 ENCOUNTER — Ambulatory Visit: Payer: Medicare HMO

## 2019-12-05 ENCOUNTER — Encounter: Payer: Self-pay | Admitting: Cardiology

## 2019-12-05 ENCOUNTER — Ambulatory Visit: Payer: Medicare HMO | Admitting: Cardiology

## 2019-12-05 VITALS — BP 168/86 | HR 84 | Temp 97.8°F | Resp 16 | Ht 61.0 in | Wt 119.3 lb

## 2019-12-05 DIAGNOSIS — R002 Palpitations: Secondary | ICD-10-CM

## 2019-12-05 DIAGNOSIS — I10 Essential (primary) hypertension: Secondary | ICD-10-CM | POA: Diagnosis not present

## 2019-12-05 DIAGNOSIS — Z8679 Personal history of other diseases of the circulatory system: Secondary | ICD-10-CM

## 2019-12-05 DIAGNOSIS — R42 Dizziness and giddiness: Secondary | ICD-10-CM

## 2019-12-05 DIAGNOSIS — I491 Atrial premature depolarization: Secondary | ICD-10-CM

## 2019-12-06 ENCOUNTER — Encounter: Payer: Self-pay | Admitting: Cardiology

## 2019-12-12 ENCOUNTER — Other Ambulatory Visit: Payer: Self-pay

## 2019-12-12 ENCOUNTER — Ambulatory Visit (INDEPENDENT_AMBULATORY_CARE_PROVIDER_SITE_OTHER): Payer: Medicare HMO

## 2019-12-12 DIAGNOSIS — I10 Essential (primary) hypertension: Secondary | ICD-10-CM

## 2019-12-12 DIAGNOSIS — I491 Atrial premature depolarization: Secondary | ICD-10-CM | POA: Diagnosis not present

## 2019-12-20 ENCOUNTER — Telehealth: Payer: Self-pay | Admitting: Cardiology

## 2019-12-23 DIAGNOSIS — R42 Dizziness and giddiness: Secondary | ICD-10-CM | POA: Diagnosis not present

## 2019-12-23 DIAGNOSIS — M25511 Pain in right shoulder: Secondary | ICD-10-CM | POA: Diagnosis not present

## 2019-12-23 DIAGNOSIS — G8929 Other chronic pain: Secondary | ICD-10-CM | POA: Diagnosis not present

## 2019-12-23 DIAGNOSIS — M25512 Pain in left shoulder: Secondary | ICD-10-CM | POA: Diagnosis not present

## 2019-12-23 DIAGNOSIS — L858 Other specified epidermal thickening: Secondary | ICD-10-CM | POA: Diagnosis not present

## 2019-12-24 DIAGNOSIS — L814 Other melanin hyperpigmentation: Secondary | ICD-10-CM | POA: Diagnosis not present

## 2019-12-24 DIAGNOSIS — L578 Other skin changes due to chronic exposure to nonionizing radiation: Secondary | ICD-10-CM | POA: Diagnosis not present

## 2019-12-24 DIAGNOSIS — D485 Neoplasm of uncertain behavior of skin: Secondary | ICD-10-CM | POA: Diagnosis not present

## 2019-12-24 DIAGNOSIS — L821 Other seborrheic keratosis: Secondary | ICD-10-CM | POA: Diagnosis not present

## 2019-12-24 DIAGNOSIS — L219 Seborrheic dermatitis, unspecified: Secondary | ICD-10-CM | POA: Diagnosis not present

## 2019-12-24 DIAGNOSIS — L858 Other specified epidermal thickening: Secondary | ICD-10-CM | POA: Diagnosis not present

## 2019-12-24 DIAGNOSIS — D225 Melanocytic nevi of trunk: Secondary | ICD-10-CM | POA: Diagnosis not present

## 2019-12-24 DIAGNOSIS — C44729 Squamous cell carcinoma of skin of left lower limb, including hip: Secondary | ICD-10-CM | POA: Diagnosis not present

## 2019-12-24 DIAGNOSIS — L570 Actinic keratosis: Secondary | ICD-10-CM | POA: Diagnosis not present

## 2019-12-25 ENCOUNTER — Encounter: Payer: Self-pay | Admitting: Family Medicine

## 2019-12-25 ENCOUNTER — Ambulatory Visit (INDEPENDENT_AMBULATORY_CARE_PROVIDER_SITE_OTHER): Payer: Medicare HMO | Admitting: Family Medicine

## 2019-12-25 ENCOUNTER — Other Ambulatory Visit: Payer: Self-pay

## 2019-12-25 ENCOUNTER — Ambulatory Visit: Payer: Self-pay

## 2019-12-25 ENCOUNTER — Ambulatory Visit (INDEPENDENT_AMBULATORY_CARE_PROVIDER_SITE_OTHER): Payer: Medicare HMO

## 2019-12-25 VITALS — BP 144/88 | HR 90 | Ht 61.0 in | Wt 114.0 lb

## 2019-12-25 DIAGNOSIS — M810 Age-related osteoporosis without current pathological fracture: Secondary | ICD-10-CM | POA: Diagnosis not present

## 2019-12-25 DIAGNOSIS — G8929 Other chronic pain: Secondary | ICD-10-CM | POA: Diagnosis not present

## 2019-12-25 DIAGNOSIS — M25511 Pain in right shoulder: Secondary | ICD-10-CM | POA: Diagnosis not present

## 2019-12-25 DIAGNOSIS — M19012 Primary osteoarthritis, left shoulder: Secondary | ICD-10-CM | POA: Diagnosis not present

## 2019-12-25 DIAGNOSIS — M19011 Primary osteoarthritis, right shoulder: Secondary | ICD-10-CM | POA: Diagnosis not present

## 2019-12-25 DIAGNOSIS — M47812 Spondylosis without myelopathy or radiculopathy, cervical region: Secondary | ICD-10-CM | POA: Diagnosis not present

## 2019-12-25 DIAGNOSIS — M25512 Pain in left shoulder: Secondary | ICD-10-CM

## 2019-12-25 DIAGNOSIS — M4182 Other forms of scoliosis, cervical region: Secondary | ICD-10-CM | POA: Diagnosis not present

## 2019-12-25 NOTE — Progress Notes (Signed)
X-ray left shoulder shows more mild arthritis like we discussed in clinic.

## 2019-12-25 NOTE — Progress Notes (Signed)
Subjective:    CC: B shoulder pain  I, Katie Rubio, LAT, ATC, am serving as scribe for Katie. Lynne Leader.  HPI: Pt is an 86 y/o female presenting w/ c/o B intermittent shoulder pain x years w/ no known MOI.  Pt rates her pain at a 5/10 and describes her pain as aching.  Pain located upper arms no radiating below the level of the elbow.  No weakness or numbness into the hand.  She does note some subjective weakness with shoulder abduction and overhead motion.  She does note the pain is not changed much.  She denies any fevers or chills and feels reasonably well otherwise.  Radiating pain: yes into B upper arms Mechanical shoulder symptoms: No Neck pain: No Numbness/tingling: No  Aggravating factors: Pain increases at night; overhead ROM and functional IR Treatments tried: Exercises given by Katie Rubio  Diagnostic imaging: No   Pertinent review of Systems: No fevers or chills.    Relevant historical information: History hypertension, leukopenia, history rheumatic fever, vertigo, head tremor, microcytosis.   Objective:    Vitals:   12/25/19 0918  BP: (!) 144/88  Pulse: 90  SpO2: 98%   General: Well Developed, well nourished, and in no acute distress.   MSK:  C-spine: Thoracic kyphosis cervical lordosis. Nontender cervical midline. Normal cervical motion however some limitation in extension due to body positioning. Upper extremity strength intact and equal bilaterally. Upper extremity reflexes equal bilateral upper extremities.  Sensation is intact throughout.  Right shoulder: Normal-appearing Normal motion without crepitation.  Some pain with abduction beyond 120 degrees Nontender. Strength intact abduction external and internal rotation. Negative empty can test and negative Hawkins and Neer's test. Negative Yergason's and speeds test.  Left shoulder: Normal-appearing Normal motion without crepitation.  Some pain with abduction beyond 120  degrees. Nontender. Intact strength abduction external and internal rotation. Negative empty can test negative Hawkins and Neer's test. Negative Yergason's and speeds test.   Lab and Radiology Results  X-ray images obtained today personally independently reviewed.  C-spine: Fusion at C5-6 level.  Diffuse osteopenia present.  Diffuse degenerative changes.  No acute fractures visible.  Right shoulder: Moderate to severe glenohumeral DJD.  Moderate AC DJD.  No acute fractures or malformation.  Left shoulder: Moderate to severe glenohumeral DJD.  Moderate AC DJD.  No acute fractures or malformation.  Await formal radiology review   Impression and Recommendations:    Assessment and Plan: 86 y.o. female with bilateral shoulder pain ongoing for years.  Based on x-ray as above pain most likely due to glenohumeral DJD.  She may have a cervical radicular component based on the appearance of her C-spine on x-ray today as well.  Fortunately she does have somewhat preserved strength and not particularly bothered by her symptoms at this moment.  Plan for trial of home health physical therapy.  Also recommend heating pad and Tylenol at bedtime.  Plan to recheck about a month after starting physical therapy.  She expressed some hesitation about physical therapy due to COVID-19 risk.  This is totally reasonable.  If she does delay physical therapy just check back a bit later with me.  Provided information about how to sign up for the COVID-19 vaccine..  Will send note to PCP Katie Conley [facility] [address] [place], [place] [address] Phone: [phone] Fax: [phone]  PDMP not reviewed this encounter. Orders Placed This Encounter  Procedures  . [place] LIMITED JOINT SPACE STRUCTURES UP BILAT(NO LINKED CHARGES)  Order Specific Question:   Reason for Exam (SYMPTOM  OR DIAGNOSIS REQUIRED)    Answer:   B shoulder pain    Order Specific Question:   Preferred  imaging location?    Answer:   [facility]  . DG Shoulder Right    Standing Status:   Future    Number of Occurrences:   1    Standing Expiration Date:   02/21/2021    Order Specific Question:   Reason for Exam (SYMPTOM  OR DIAGNOSIS REQUIRED)    Answer:   eval BL shoulder pain    Order Specific Question:   Preferred imaging location?    Answer:   [facility]    Order Specific Question:   Radiology Contrast Protocol - do NOT remove file path    Answer:   \\[email or web address]  . DG Shoulder Left    Standing Status:   Future    Number of Occurrences:   1    Standing Expiration Date:   02/21/2021    Order Specific Question:   Reason for Exam (SYMPTOM  OR DIAGNOSIS REQUIRED)    Answer:   eval BL shoulder pain    Order Specific Question:   Preferred imaging location?    Answer:   [facility]    Order Specific Question:   Radiology Contrast Protocol - do NOT remove file path    Answer:   \\[email or web address]  . DG Cervical Spine Complete    Standing Status:   Future    Number of Occurrences:   1    Standing Expiration Date:   02/21/2021    Order Specific Question:   Reason for Exam (SYMPTOM  OR DIAGNOSIS REQUIRED)    Answer:   eval Bl shoulder pain    Order Specific Question:   Preferred imaging location?    Answer:   [facility]    Order Specific Question:   Radiology Contrast Protocol - do NOT remove file path    Answer:   \\[email or web address]  . Ambulatory referral to Home Health    Referral Priority:   Routine    Referral Type:   Home Health Care    Referral Reason:   Specialty Services Required    Requested Specialty:   [place]    Number of Visits Requested:   1   No orders of the defined types were placed in this encounter.   Discussed warning signs or symptoms. Please see discharge instructions.  Patient expresses understanding.   The above documentation has been reviewed and is accurate and complete Lynne Leader

## 2019-12-25 NOTE — Progress Notes (Signed)
X-ray right shoulder shows arthritis like we discussed in clinic

## 2019-12-25 NOTE — Patient Instructions (Addendum)
Thank you for coming in today.  You should hear about home health PT.  If you want to delay just let me know and they can schedule into the future.  Try using voltaren gel up to 4x daily for pain.  Also take 1 tylenol arthritis at bedtime for pain.  Try using a heating pad for pain.  Recheck in about 1 month. However if you are delaying PT and nothing else is changed it may be a good idea to delay the follow up.   COVID-19 Vaccine Information can be found at: ShippingScam.co.uk For questions related to vaccine distribution or appointments, please email [email or web address] or call 303-627-5778.

## 2019-12-25 NOTE — Progress Notes (Signed)
X-ray C-spine shows arthritis throughout spine and some mild scoliosis like we discussed.

## 2019-12-26 NOTE — Telephone Encounter (Signed)
Patient has had several critical event reports on event monitor for nsVT without symptoms. Will need appt to discuss and decide on further evaluation

## 2019-12-26 NOTE — Telephone Encounter (Signed)
Done- 01/03/20 @ 1 :30 thanks!

## 2020-01-03 ENCOUNTER — Ambulatory Visit: Payer: Medicare HMO | Admitting: Cardiology

## 2020-01-03 ENCOUNTER — Other Ambulatory Visit: Payer: Self-pay

## 2020-01-03 ENCOUNTER — Encounter: Payer: Self-pay | Admitting: Cardiology

## 2020-01-03 VITALS — BP 169/73 | HR 84 | Temp 98.0°F | Resp 18 | Ht 61.0 in | Wt 115.0 lb

## 2020-01-03 DIAGNOSIS — I491 Atrial premature depolarization: Secondary | ICD-10-CM

## 2020-01-03 DIAGNOSIS — I10 Essential (primary) hypertension: Secondary | ICD-10-CM | POA: Diagnosis not present

## 2020-01-03 DIAGNOSIS — I4729 Other ventricular tachycardia: Secondary | ICD-10-CM

## 2020-01-03 DIAGNOSIS — I472 Ventricular tachycardia: Secondary | ICD-10-CM | POA: Diagnosis not present

## 2020-01-03 MED ORDER — METOPROLOL TARTRATE 25 MG PO TABS: 12.5000 mg | ORAL_TABLET | Freq: Two times a day (BID) | ORAL | 1 refills | Status: DC

## 2020-01-03 NOTE — Progress Notes (Signed)
Primary Physician:  Deland Pretty, MD   Patient ID: Katie Rubio, female    DOB: [birth year], 86 y.o.   MRN: [id number]  Subjective:    Chief Complaint  Patient presents with  . NSVT without symmtoms    HPI: Katie Rubio  is a 86 y.o. female  with splenic artery aneurysm, spinal stenosis, DDD, rheumatic fever at age 38, originally evaluated by [place] for frequent PAC's. She did not undergo testing in view of lack of symptoms. She was recently seen for acute visit for brief episodes of dizziness, felt to be potentially related to vertigo as she had history of this, and occasional palpitations. She was placed on 30 day event monitor and underwent echocardiogram and now presents for follow up.   I had asked patient to come in to see me as she had had several episodes of nonsustained ventricular tachycardia noted on her event monitor.  Patient reports that she has not had any symptoms since wearing her monitor.  No recurrence of dizziness or palpitations.  No syncope.  She is overall feeling well.  Her daughter is present at bedside.  She has history of elevated blood pressures at physician offices, but generally stable at home. Not currently on medical therapy. She has been monitoring at home and states is averaging in the 244-010'U systolic. No history of hyperlipidemia, diabetes, or thyroid disorders.   She continues to live at home with her husband, and does most of her household chores.     Past Medical History:  Diagnosis Date  . Allergic rhinitis   . Atrophic vaginitis   . Benign head tremor   . Dizziness   . History of anxiety   . History of rheumatic fever   . Macrocytosis    History of chronic macrocytosis  . Meralgia paresthetica    History of  . Osteoporosis   . Palpitations    occasional  . Post-menopausal   . Sclerosis of the skin    lichen  . Splenic artery aneurysm ([place])   . Syncope   . Tinnitus    Chronic    Past Surgical History:  Procedure  Laterality Date  . colon polyps      Social History   Socioeconomic History  . Marital status: Married    Spouse name: Not on file  . Number of children: 5  . Years of education: Not on file  . Highest education level: Not on file  Occupational History  . Not on file  Tobacco Use  . Smoking status: Former Smoker    Packs/day: 0.25    Years: 5.00    Pack years: 1.25    Types: Cigarettes    Quit date: 1968    Years since quitting: 53.1  . Smokeless tobacco: Never Used  . Tobacco comment: Pt was a some day smoker.  Substance and Sexual Activity  . Alcohol use: No    Alcohol/week: 0.0 standard drinks  . Drug use: No  . Sexual activity: Not Currently    Partners: Male    Birth control/protection: Abstinence, Post-menopausal  Other Topics Concern  . Not on file  Social History Narrative  . Not on file   Social Determinants of Health   Financial Resource Strain:   . Difficulty of Paying Living Expenses: Not on file  Food Insecurity:   . Worried About Charity fundraiser in the Last Year: Not on file  . Ran Out of Food in the Last Year: Not on  file  Transportation Needs:   . Film/video editor (Medical): Not on file  . Lack of Transportation (Non-Medical): Not on file  Physical Activity:   . Days of Exercise per Week: Not on file  . Minutes of Exercise per Session: Not on file  Stress:   . Feeling of Stress : Not on file  Social Connections:   . Frequency of Communication with Friends and Family: Not on file  . Frequency of Social Gatherings with Friends and Family: Not on file  . Attends Religious Services: Not on file  . Active Member of Clubs or Organizations: Not on file  . Attends Archivist Meetings: Not on file  . Marital Status: Not on file  Intimate Partner Violence:   . Fear of Current or Ex-Partner: Not on file  . Emotionally Abused: Not on file  . Physically Abused: Not on file  . Sexually Abused: Not on file    Review of Systems   Constitution: Negative for decreased appetite, malaise/fatigue, weight gain and weight loss.  Eyes: Negative for visual disturbance.  Cardiovascular: Positive for palpitations. Negative for chest pain, claudication, dyspnea on exertion, leg swelling, orthopnea and syncope.  Respiratory: Negative for hemoptysis and wheezing.   Endocrine: Negative for cold intolerance and heat intolerance.  Hematologic/Lymphatic: Does not bruise/bleed easily.  Skin: Negative for nail changes.  Musculoskeletal: Positive for back pain. Negative for falls, muscle weakness and myalgias.  Gastrointestinal: Negative for abdominal pain, change in bowel habit, nausea and vomiting.  Neurological: Positive for dizziness. Negative for difficulty with concentration, focal weakness and headaches.  Psychiatric/Behavioral: Negative for altered mental status and suicidal ideas.  All other systems reviewed and are negative.     Objective:  Blood pressure (!) 169/73, pulse 84, temperature 98 F (36.7 C), temperature source Temporal, resp. rate 18, height 5' 1"  (1.549 m), weight 115 lb (52.2 kg), last menstrual period 11/14/1978, SpO2 99 %. Body mass index is 21.73 kg/m.    Physical Exam  Constitutional: She is oriented to person, place, and time. Vital signs are normal. She appears well-developed and well-nourished.  HENT:  Head: Normocephalic and atraumatic.  Cardiovascular: Normal rate, regular rhythm, normal heart sounds and intact distal pulses.  Occasional extrasystoles are present.  Pulmonary/Chest: Effort normal and breath sounds normal. No accessory muscle usage. No respiratory distress.  Abdominal: Soft. Bowel sounds are normal.  Musculoskeletal:        General: Normal range of motion.     Cervical back: Normal range of motion.  Neurological: She is alert and oriented to person, place, and time.  Skin: Skin is warm and dry.  Vitals reviewed.  Radiology: No results found.  Laboratory examination:    01/04/2019: Glucose 95, BUN/Cr 29/1.0, eGFR 54.4, Na/K 142/4.8, AST 25, ALT 23, Alk Phos 61. Rest of CMP normal.  H/H 11.6/36.9, MCV 96.9, Platelets 167, WBD 4.4, RBC 3.81, Rest of CBC normal.  Cholesterol 205, TG 48, HDL 73, calc LDL 122. Vitamin D25 41.  No flowsheet data found. No flowsheet data found. Lipid Panel  No results found for: CHOL, TRIG, HDL, CHOLHDL, VLDL, LDLCALC, LDLDIRECT HEMOGLOBIN A1C No results found for: HGBA1C, MPG TSH No results for input(s): TSH in the last 8760 hours.  PRN Meds:. There are no discontinued medications. Current Meds  Medication Sig  . Cholecalciferol (VITAMIN D) 2000 UNITS CAPS Take 2,000 Units by mouth every other day.   . clobetasol ointment (TEMOVATE) 0.05 % Apply a small amount topically BID for up to  2 weeks prn. Can use the cream 2 x a week baseline as needed. Not for daily long term use.  . Coenzyme Q10 (CO Q 10 PO) Take by mouth every other day.  . Cyanocobalamin (VITAMIN B 12 PO) Take 1 tablet by mouth daily.  . Flaxseed, Linseed, (FLAXSEED OIL) 1000 MG CAPS Take by mouth daily.  . magnesium 30 MG tablet Take 30 mg by mouth daily.  . Nutritional Supplements (JUICE PLUS FIBRE PO) Take by mouth daily.  . Omega-3 Fatty Acids (FISH OIL PO) Take by mouth daily.     Cardiac Studies:   Echocardiogram 12/12/2019:  Normal LV systolic function with visual EF 55-60%. Left ventricle cavity  is normal in size. Normal global wall motion. Doppler evidence of grade I  (impaired) diastolic dysfunction, normal LAP.  Structurally normal tricuspid valve with trace regurgitation. No evidence  of pulmonary hypertension.  Vascular US LE 05/12/2011: No evidence of hemodynamically significant lower extremity arterial occlusive disease at rest  Assessment:     ICD-10-CM   1. NSVT (nonsustained ventricular tachycardia) (HCC)  I47.2 PCV MYOCARDIAL PERFUSION WITH LEXISCAN  2. Essential hypertension  I10   3. Premature atrial complex  I49.1     EKG  12/05/2019: Normal sinus rhythm/arrhythmia at 85 bpm with 1 PAC's, left atrial enlargement, normal axis, no evidence of ischemia. No changes from EKG 07/31/2019.  EKG PCP 01/14/2019: Atrial Arrhythmia.   Recommendations:   I had asked patient to come in for an appointment due to several episodes of nonsustained VT noted on recent event monitor that appears to be asymptomatic. Monitor will be completed in the next few days.  Although she is 86 years of age and is asymptomatic, given NSVT, recommend an evaluation with Lexiscan nuclear stress testing for further evaluation.  Patient and her daughter wish to avoid any invasive procedures at this time unless necessary. I have asked them to contact me should she have any symptoms.  I will start her on metoprolol tartrate 12.5 mg twice daily for continued elevated blood pressure, frequent PACs, and also episodes of NSVT.  I have discussed her recent echocardiogram which shows normal LVEF.  We will plan to see her back in 4 weeks for follow-up.  Miquel Dunn, MSN, APRN, FNP-C [facility]. [place] Office: [phone] Fax: [phone]

## 2020-01-06 DIAGNOSIS — C44729 Squamous cell carcinoma of skin of left lower limb, including hip: Secondary | ICD-10-CM | POA: Diagnosis not present

## 2020-01-13 ENCOUNTER — Other Ambulatory Visit: Payer: Self-pay

## 2020-01-13 ENCOUNTER — Ambulatory Visit: Payer: Medicare HMO

## 2020-01-13 DIAGNOSIS — I4949 Other premature depolarization: Secondary | ICD-10-CM | POA: Diagnosis not present

## 2020-01-13 DIAGNOSIS — I4729 Other ventricular tachycardia: Secondary | ICD-10-CM

## 2020-01-13 DIAGNOSIS — I472 Ventricular tachycardia: Secondary | ICD-10-CM

## 2020-01-16 DIAGNOSIS — I10 Essential (primary) hypertension: Secondary | ICD-10-CM | POA: Diagnosis not present

## 2020-01-16 DIAGNOSIS — Z7982 Long term (current) use of aspirin: Secondary | ICD-10-CM | POA: Diagnosis not present

## 2020-01-16 DIAGNOSIS — M810 Age-related osteoporosis without current pathological fracture: Secondary | ICD-10-CM | POA: Diagnosis not present

## 2020-01-16 DIAGNOSIS — N390 Urinary tract infection, site not specified: Secondary | ICD-10-CM | POA: Diagnosis not present

## 2020-01-20 DIAGNOSIS — Z0000 Encounter for general adult medical examination without abnormal findings: Secondary | ICD-10-CM | POA: Diagnosis not present

## 2020-01-20 DIAGNOSIS — I472 Ventricular tachycardia: Secondary | ICD-10-CM | POA: Diagnosis not present

## 2020-01-20 DIAGNOSIS — R222 Localized swelling, mass and lump, trunk: Secondary | ICD-10-CM | POA: Diagnosis not present

## 2020-01-20 DIAGNOSIS — G250 Essential tremor: Secondary | ICD-10-CM | POA: Diagnosis not present

## 2020-01-20 DIAGNOSIS — R69 Illness, unspecified: Secondary | ICD-10-CM | POA: Diagnosis not present

## 2020-01-20 DIAGNOSIS — R2689 Other abnormalities of gait and mobility: Secondary | ICD-10-CM | POA: Diagnosis not present

## 2020-01-20 DIAGNOSIS — K5900 Constipation, unspecified: Secondary | ICD-10-CM | POA: Diagnosis not present

## 2020-01-20 DIAGNOSIS — I728 Aneurysm of other specified arteries: Secondary | ICD-10-CM | POA: Diagnosis not present

## 2020-01-20 DIAGNOSIS — N1831 Chronic kidney disease, stage 3a: Secondary | ICD-10-CM | POA: Diagnosis not present

## 2020-01-20 DIAGNOSIS — G4762 Sleep related leg cramps: Secondary | ICD-10-CM | POA: Diagnosis not present

## 2020-01-22 ENCOUNTER — Ambulatory Visit: Payer: Medicare HMO | Admitting: Family Medicine

## 2020-01-23 DIAGNOSIS — M810 Age-related osteoporosis without current pathological fracture: Secondary | ICD-10-CM | POA: Diagnosis not present

## 2020-01-23 DIAGNOSIS — I10 Essential (primary) hypertension: Secondary | ICD-10-CM | POA: Diagnosis not present

## 2020-01-24 ENCOUNTER — Telehealth: Payer: Self-pay | Admitting: Obstetrics and Gynecology

## 2020-01-24 NOTE — Telephone Encounter (Signed)
Spoke with patient. Patient is calling to confirm what she uses clobetasol ointment for? Patient states she recently had a BMD and was reviewing her BMD results and medication with someone at providers office and was advised she should not be on clobetasol ointment. Patient states she was unsure what she used the medication for.   Reviewed OV notes dated 04/29/19, clobetasol ointment PRN for Lichen sclerosis. Advised patient this should be used PRN and only applied externally for LS. Patient asking if vaginal estrogen cream is the same? Advised no, that is used for vaginal atrophy and that is placed vaginally. Patient states she no longer uses vaginal estrogen cream. Patient states she will update her provider with indication for clobetasol cream. RN advised patient this is a common tx for LS. Questions answered. Advised I will forward to Dr. Talbert Nan to review, will return call if any additional recommendations.   Routing to provider for final review. Patient is agreeable to disposition. Will close encounter.

## 2020-01-24 NOTE — Telephone Encounter (Signed)
Patient has a question for nurse about clobetasol ointment.

## 2020-01-24 NOTE — Telephone Encounter (Signed)
Left message to call Buster Schueller, RN at [facility] [phone].   

## 2020-01-31 ENCOUNTER — Ambulatory Visit: Payer: Medicare HMO | Admitting: Cardiology

## 2020-01-31 ENCOUNTER — Other Ambulatory Visit: Payer: Self-pay

## 2020-01-31 ENCOUNTER — Encounter: Payer: Self-pay | Admitting: Cardiology

## 2020-01-31 VITALS — BP 150/70 | HR 95 | Temp 97.2°F | Ht 61.0 in | Wt 113.5 lb

## 2020-01-31 DIAGNOSIS — Z8679 Personal history of other diseases of the circulatory system: Secondary | ICD-10-CM | POA: Diagnosis not present

## 2020-01-31 DIAGNOSIS — R42 Dizziness and giddiness: Secondary | ICD-10-CM | POA: Diagnosis not present

## 2020-01-31 DIAGNOSIS — I10 Essential (primary) hypertension: Secondary | ICD-10-CM

## 2020-01-31 DIAGNOSIS — I4729 Other ventricular tachycardia: Secondary | ICD-10-CM

## 2020-01-31 DIAGNOSIS — I491 Atrial premature depolarization: Secondary | ICD-10-CM

## 2020-01-31 DIAGNOSIS — I472 Ventricular tachycardia: Secondary | ICD-10-CM | POA: Diagnosis not present

## 2020-01-31 DIAGNOSIS — Z87891 Personal history of nicotine dependence: Secondary | ICD-10-CM

## 2020-01-31 NOTE — Progress Notes (Signed)
Primary Physician:  Deland Pretty, MD   Patient ID: Katie Rubio, female    DOB: Mar 25, 1929, 86 y.o.   MRN: [id number]  Subjective:    Chief Complaint  Patient presents with  . Hypertension  . NSVT  . Follow-up    4 week  . Results    stress test    HPI: Katie Rubio  is a 86 y.o. female  with splenic artery aneurysm, spinal stenosis, DDD, rheumatic fever at age 72, history of PACs, history of asymptomatic nonsustained ventricular tachycardia presents to the office with a chief complaint of  follow-up after undergoing stress test.  Patient is accompanied by her daughter today's office visit.  In the recent past patient was having symptoms of dizziness which was predominately felt to be due to her vertigo but because of occasional palpitations she underwent a 30-day event monitor and echocardiogram.  The event monitor noted several episodes of nonsustained ventricular tachycardia.  Echocardiogram noted a preserved left ventricular systolic function.  At the last office visit stress test was ordered to rule out any reversible ischemia and/or prior infarct.  Results of the most nuclear stress test were reviewed with the patient and her daughter at today's office visit and noted below for further reference.  Patient was started on Lopressor 12.5 mg p.o. twice daily at the last office visit given her nonsustained ventricular tachycardia.  However, patient has not started this medication despite it being prescribed at least 1 month ago.  Patient states that she has multiple things to do from a medical standpoint and she is just triaging in regards to what needs to be done.  She has plans to start Lopressor in the near future.  Her blood pressures are elevated today and was reiterated on starting Lopressor and if her systolic blood pressures continued to be elevated she needs to be on antihypertensive medications.   Patient states that she will start Lopressor once she reaches home.   But in the meantime patient states that when she checks her blood pressure her systolic blood pressure ranges between 188-416 mmHg and diastolic blood pressures range between 70-80 mmHg.    No recurrence of dizziness or palpitations.  No syncope.  She is overall feeling well.  No history of hyperlipidemia, diabetes, or thyroid disorders.   She continues to live at home with her husband, and does most of her household chores.     Past Medical History:  Diagnosis Date  . Allergic rhinitis   . Atrophic vaginitis   . Benign head tremor   . Dizziness   . History of anxiety   . History of rheumatic fever   . Macrocytosis    History of chronic macrocytosis  . Meralgia paresthetica    History of  . Osteoporosis   . Palpitations    occasional  . Post-menopausal   . Sclerosis of the skin    lichen  . Splenic artery aneurysm ([place])   . Syncope   . Tinnitus    Chronic    Past Surgical History:  Procedure Laterality Date  . colon polyps      Social History   Socioeconomic History  . Marital status: Married    Spouse name: Not on file  . Number of children: 5  . Years of education: Not on file  . Highest education level: Not on file  Occupational History  . Not on file  Tobacco Use  . Smoking status: Former Smoker    Packs/day: 0.25  Years: 5.00    Pack years: 1.25    Types: Cigarettes    Quit date: 88    Years since quitting: 53.2  . Smokeless tobacco: Never Used  . Tobacco comment: Pt was a some day smoker.  Substance and Sexual Activity  . Alcohol use: No    Alcohol/week: 0.0 standard drinks  . Drug use: No  . Sexual activity: Not Currently    Partners: Male    Birth control/protection: Abstinence, Post-menopausal  Other Topics Concern  . Not on file  Social History Narrative  . Not on file   Social Determinants of Health   Financial Resource Strain:   . Difficulty of Paying Living Expenses:   Food Insecurity:   . Worried About Charity fundraiser  in the Last Year:   . Arboriculturist in the Last Year:   Transportation Needs:   . Film/video editor (Medical):   Marland Kitchen Lack of Transportation (Non-Medical):   Physical Activity:   . Days of Exercise per Week:   . Minutes of Exercise per Session:   Stress:   . Feeling of Stress :   Social Connections:   . Frequency of Communication with Friends and Family:   . Frequency of Social Gatherings with Friends and Family:   . Attends Religious Services:   . Active Member of Clubs or Organizations:   . Attends Archivist Meetings:   Marland Kitchen Marital Status:   Intimate Partner Violence:   . Fear of Current or Ex-Partner:   . Emotionally Abused:   Marland Kitchen Physically Abused:   . Sexually Abused:     Review of Systems  Constitution: Negative for decreased appetite, malaise/fatigue, weight gain and weight loss.  Eyes: Negative for visual disturbance.  Cardiovascular: Negative for chest pain, claudication, dyspnea on exertion, leg swelling, orthopnea, palpitations and syncope.  Respiratory: Negative for hemoptysis and wheezing.   Endocrine: Negative for cold intolerance and heat intolerance.  Hematologic/Lymphatic: Does not bruise/bleed easily.  Skin: Negative for nail changes.  Musculoskeletal: Positive for back pain. Negative for falls, muscle weakness and myalgias.  Gastrointestinal: Negative for abdominal pain, change in bowel habit, nausea and vomiting.  Neurological: Negative for difficulty with concentration, dizziness, focal weakness and headaches.  Psychiatric/Behavioral: Negative for altered mental status and suicidal ideas.  All other systems reviewed and are negative.     Objective:  Blood pressure (!) 150/70, pulse 95, temperature (!) 97.2 F (36.2 C), height 5' 1"  (1.549 m), weight 113 lb 8 oz (51.5 kg), last menstrual period 11/14/1978, SpO2 96 %. Body mass index is 21.45 kg/m.    Physical Exam  Constitutional: She is oriented to person, place, and time. Vital signs are  normal. She appears well-developed and well-nourished.  HENT:  Head: Normocephalic and atraumatic.  Cardiovascular: Normal rate, regular rhythm, normal heart sounds and intact distal pulses.  Occasional extrasystoles are present.  Pulmonary/Chest: Effort normal and breath sounds normal. No accessory muscle usage. No respiratory distress.  Abdominal: Soft. Bowel sounds are normal.  Musculoskeletal:        General: Normal range of motion.     Cervical back: Normal range of motion.  Neurological: She is alert and oriented to person, place, and time.  Skin: Skin is warm and dry.  Vitals reviewed.  Radiology: No results found.  Laboratory examination:   01/04/2019: Glucose 95, BUN/Cr 29/1.0, eGFR 54.4, Na/K 142/4.8, AST 25, ALT 23, Alk Phos 61. Rest of CMP normal.  H/H 11.6/36.9, MCV 96.9, Platelets  167, WBD 4.4, RBC 3.81, Rest of CBC normal.  Cholesterol 205, TG 48, HDL 73, calc LDL 122. Vitamin D25 41.  No flowsheet data found. No flowsheet data found. Lipid Panel  No results found for: CHOL, TRIG, HDL, CHOLHDL, VLDL, LDLCALC, LDLDIRECT HEMOGLOBIN A1C No results found for: HGBA1C, MPG TSH No results for input(s): TSH in the last 8760 hours.  PRN Meds:. There are no discontinued medications. Current Meds  Medication Sig  . Cholecalciferol (VITAMIN D) 2000 UNITS CAPS Take 2,000 Units by mouth every other day.   . clobetasol ointment (TEMOVATE) 0.05 % Apply a small amount topically BID for up to 2 weeks prn. Can use the cream 2 x a week baseline as needed. Not for daily long term use.  . Coenzyme Q10 (CO Q 10 PO) Take by mouth every other day.  . Cyanocobalamin (VITAMIN B 12 PO) Take 1 tablet by mouth daily.  . Flaxseed, Linseed, (FLAXSEED OIL) 1000 MG CAPS Take by mouth daily.  . magnesium 30 MG tablet Take 30 mg by mouth daily.  . Nutritional Supplements (JUICE PLUS FIBRE PO) Take by mouth daily.  . Omega-3 Fatty Acids (FISH OIL PO) Take by mouth daily.     Cardiac Studies:    Echocardiogram 12/12/2019:  Normal LV systolic function with visual EF 55-60%. Left ventricle cavity  is normal in size. Normal global wall motion. Doppler evidence of grade I  (impaired) diastolic dysfunction, normal LAP.  Structurally normal tricuspid valve with trace regurgitation. No evidence  of pulmonary hypertension.  Stress test: Lexiscan (Walking with mod Bruce)Tetrofosmin Stress Test  01/13/2020: Nondiagnostic ECG stress. Myocardial perfusion is normal. Overall LV systolic function is normal without regional wall motion abnormalities. Stress LV EF: 62%.  No previous exam available for comparison.  Vascular US LE 05/12/2011: No evidence of hemodynamically significant lower extremity arterial occlusive disease at rest  Assessment:     ICD-10-CM   1. NSVT (nonsustained ventricular tachycardia) (HCC)  I47.2   2. Essential hypertension  I10   3. Premature atrial complex  I49.1   4. History of rheumatic fever  Z86.79   5. Dizziness  R42   6. Former smoker  Z87.891     EKG 12/05/2019: Normal sinus rhythm/arrhythmia at 85 bpm with 1 PAC's, left atrial enlargement, normal axis, no evidence of ischemia. No changes from EKG 07/31/2019.  EKG PCP 01/14/2019: Atrial Arrhythmia.   Recommendations:  Nonsustained ventricular tachycardia:  The episodes of nonsustained ventricular tachycardia noted on a 30-day event monitor was noted to be asymptomatic.  Patient underwent an ischemic work-up to rule out reversible ischemia or prior infarct.  Study was considered to be low risk additional details noted below.  We will still recommend initiating Lopressor 12.5 mg p.o. twice daily as prescribed approximately 1 month ago.  This should help with her underlying arrhythmia and her blood pressure.  Echocardiogram shows a preserved left ventricular systolic function.  We also discussed the possibility of undergoing sleep study to rule out sleep apnea as this will help with her  asymptomatic nonsustained ventricular tachycardia, blood pressure, improving modifiable cardiovascular risk factors.  This was recommended in the context of the patient being a very functional 86 year old female.  Essential hypertension:  Patient states that she does not have high blood pressure.;  However, patient had multiple readings in the past when her systolic blood pressures have been more than 120 mmHg.  By definition she does have high blood pressure but currently is not on any medicine medications  per her choice.  Recommended her and her daughter that she start Lopressor 12.5 mg p.o. twice daily.  If her systolic blood pressure are consistently greater than 140 mmHg despite the addition of Lopressor her medication should further be titrated.  Patient states that she will continue with lifestyle modifications for now.  We will start Lopressor as prescribed 1 month ago.  We will keep a log of her blood pressures and call the office if her SBP is greater than 140 mmHg consistently.  Review test results: Went over the results of the nuclear stress and with the patient and her daughter at today's office visit.  Their questions and concerns were addressed to their satisfaction.  Findings noted above for further reference.  --Continue cardiac medications as reconciled in final medication list. --Return in about 6 months (around 08/02/2020) for HTN. Or sooner if needed. --Continue follow-up with your primary care physician regarding the management of your other chronic comorbid conditions.  Patient's questions and concerns were addressed to her satisfaction. She voices understanding of the instructions provided during this encounter.   This note was created using a voice recognition software as a result there may be grammatical errors inadvertently enclosed that do not reflect the nature of this encounter. Every attempt is made to correct such errors.  Rex Kras, DO, [facility]. [place] Office: [phone]

## 2020-01-31 NOTE — Patient Instructions (Signed)
Please keep a blood pressure log and bring that in at  your next office visit. Call the office if the top number is consistently greater than 1110mmHg.   Please remember to bring in your medication bottles in at the next visit.   Please start your lopressor.   Medications that were discontinued at today's visit: None  Recommend follow up with your PCP as scheduled.

## 2020-02-13 ENCOUNTER — Telehealth: Payer: Self-pay

## 2020-02-13 NOTE — Telephone Encounter (Signed)
Okay 

## 2020-02-13 NOTE — Telephone Encounter (Signed)
The patient called and asked if it was ok, that her ger her Prolia  Infusions, please advise.

## 2020-03-02 DIAGNOSIS — M21962 Unspecified acquired deformity of left lower leg: Secondary | ICD-10-CM | POA: Diagnosis not present

## 2020-03-02 DIAGNOSIS — M2042 Other hammer toe(s) (acquired), left foot: Secondary | ICD-10-CM | POA: Diagnosis not present

## 2020-03-02 DIAGNOSIS — M79675 Pain in left toe(s): Secondary | ICD-10-CM | POA: Diagnosis not present

## 2020-03-02 DIAGNOSIS — L84 Corns and callosities: Secondary | ICD-10-CM | POA: Diagnosis not present

## 2020-03-03 DIAGNOSIS — M6281 Muscle weakness (generalized): Secondary | ICD-10-CM | POA: Diagnosis not present

## 2020-03-03 DIAGNOSIS — Z9181 History of falling: Secondary | ICD-10-CM | POA: Diagnosis not present

## 2020-03-03 DIAGNOSIS — R293 Abnormal posture: Secondary | ICD-10-CM | POA: Diagnosis not present

## 2020-03-03 DIAGNOSIS — R531 Weakness: Secondary | ICD-10-CM | POA: Diagnosis not present

## 2020-03-09 DIAGNOSIS — R293 Abnormal posture: Secondary | ICD-10-CM | POA: Diagnosis not present

## 2020-03-09 DIAGNOSIS — M6281 Muscle weakness (generalized): Secondary | ICD-10-CM | POA: Diagnosis not present

## 2020-03-09 DIAGNOSIS — R531 Weakness: Secondary | ICD-10-CM | POA: Diagnosis not present

## 2020-03-09 DIAGNOSIS — Z9181 History of falling: Secondary | ICD-10-CM | POA: Diagnosis not present

## 2020-03-12 DIAGNOSIS — Z9181 History of falling: Secondary | ICD-10-CM | POA: Diagnosis not present

## 2020-03-12 DIAGNOSIS — R293 Abnormal posture: Secondary | ICD-10-CM | POA: Diagnosis not present

## 2020-03-12 DIAGNOSIS — M6281 Muscle weakness (generalized): Secondary | ICD-10-CM | POA: Diagnosis not present

## 2020-03-12 DIAGNOSIS — R531 Weakness: Secondary | ICD-10-CM | POA: Diagnosis not present

## 2020-03-16 DIAGNOSIS — R293 Abnormal posture: Secondary | ICD-10-CM | POA: Diagnosis not present

## 2020-03-16 DIAGNOSIS — Z9181 History of falling: Secondary | ICD-10-CM | POA: Diagnosis not present

## 2020-03-16 DIAGNOSIS — M6281 Muscle weakness (generalized): Secondary | ICD-10-CM | POA: Diagnosis not present

## 2020-03-16 DIAGNOSIS — R531 Weakness: Secondary | ICD-10-CM | POA: Diagnosis not present

## 2020-03-18 DIAGNOSIS — M9904 Segmental and somatic dysfunction of sacral region: Secondary | ICD-10-CM | POA: Diagnosis not present

## 2020-03-18 DIAGNOSIS — M7542 Impingement syndrome of left shoulder: Secondary | ICD-10-CM | POA: Diagnosis not present

## 2020-03-18 DIAGNOSIS — R69 Illness, unspecified: Secondary | ICD-10-CM | POA: Diagnosis not present

## 2020-03-19 DIAGNOSIS — M6281 Muscle weakness (generalized): Secondary | ICD-10-CM | POA: Diagnosis not present

## 2020-03-19 DIAGNOSIS — R293 Abnormal posture: Secondary | ICD-10-CM | POA: Diagnosis not present

## 2020-03-19 DIAGNOSIS — R531 Weakness: Secondary | ICD-10-CM | POA: Diagnosis not present

## 2020-03-19 DIAGNOSIS — Z9181 History of falling: Secondary | ICD-10-CM | POA: Diagnosis not present

## 2020-03-23 ENCOUNTER — Telehealth: Payer: Self-pay | Admitting: Family Medicine

## 2020-03-23 DIAGNOSIS — R293 Abnormal posture: Secondary | ICD-10-CM | POA: Diagnosis not present

## 2020-03-23 DIAGNOSIS — R531 Weakness: Secondary | ICD-10-CM | POA: Diagnosis not present

## 2020-03-23 DIAGNOSIS — Z9181 History of falling: Secondary | ICD-10-CM | POA: Diagnosis not present

## 2020-03-23 DIAGNOSIS — M6281 Muscle weakness (generalized): Secondary | ICD-10-CM | POA: Diagnosis not present

## 2020-03-23 NOTE — Telephone Encounter (Signed)
Pt saw Dr. Georgina Snell for her shoulder. She remembers the x ray showing a separation. She was hesitant, but has started exercising and wanted to talk with [place] to be sure the exercises are OK. [phone]

## 2020-03-23 NOTE — Telephone Encounter (Signed)
Schedule a visit to discuss??

## 2020-03-24 NOTE — Telephone Encounter (Signed)
Called pt and she had questions about things she's been doing in PT. She has since been referred to PT by her PCP and has been going to PT at [facility].  I advise pt to ask her PT about questions she has pertaining to exercises she should/shouldn't be doing.  If she has more questions from there, then she can schedule a f/u appt w/ Dr. Georgina Snell.

## 2020-03-24 NOTE — Telephone Encounter (Signed)
I think the exercises should be okay.  However if you are having problems is probably best for [place] to see each other in person as has been several months since I last had an evaluation with you.

## 2020-03-25 DIAGNOSIS — M810 Age-related osteoporosis without current pathological fracture: Secondary | ICD-10-CM | POA: Diagnosis not present

## 2020-03-26 DIAGNOSIS — Z9181 History of falling: Secondary | ICD-10-CM | POA: Diagnosis not present

## 2020-03-26 DIAGNOSIS — R531 Weakness: Secondary | ICD-10-CM | POA: Diagnosis not present

## 2020-03-26 DIAGNOSIS — M6281 Muscle weakness (generalized): Secondary | ICD-10-CM | POA: Diagnosis not present

## 2020-03-26 DIAGNOSIS — R293 Abnormal posture: Secondary | ICD-10-CM | POA: Diagnosis not present

## 2020-04-01 DIAGNOSIS — R531 Weakness: Secondary | ICD-10-CM | POA: Diagnosis not present

## 2020-04-01 DIAGNOSIS — M6281 Muscle weakness (generalized): Secondary | ICD-10-CM | POA: Diagnosis not present

## 2020-04-01 DIAGNOSIS — R293 Abnormal posture: Secondary | ICD-10-CM | POA: Diagnosis not present

## 2020-04-01 DIAGNOSIS — Z9181 History of falling: Secondary | ICD-10-CM | POA: Diagnosis not present

## 2020-04-03 DIAGNOSIS — Z9181 History of falling: Secondary | ICD-10-CM | POA: Diagnosis not present

## 2020-04-03 DIAGNOSIS — R293 Abnormal posture: Secondary | ICD-10-CM | POA: Diagnosis not present

## 2020-04-03 DIAGNOSIS — R531 Weakness: Secondary | ICD-10-CM | POA: Diagnosis not present

## 2020-04-03 DIAGNOSIS — M6281 Muscle weakness (generalized): Secondary | ICD-10-CM | POA: Diagnosis not present

## 2020-04-06 DIAGNOSIS — M6281 Muscle weakness (generalized): Secondary | ICD-10-CM | POA: Diagnosis not present

## 2020-04-06 DIAGNOSIS — R531 Weakness: Secondary | ICD-10-CM | POA: Diagnosis not present

## 2020-04-06 DIAGNOSIS — R293 Abnormal posture: Secondary | ICD-10-CM | POA: Diagnosis not present

## 2020-04-06 DIAGNOSIS — Z9181 History of falling: Secondary | ICD-10-CM | POA: Diagnosis not present

## 2020-04-09 DIAGNOSIS — M6281 Muscle weakness (generalized): Secondary | ICD-10-CM | POA: Diagnosis not present

## 2020-04-09 DIAGNOSIS — R531 Weakness: Secondary | ICD-10-CM | POA: Diagnosis not present

## 2020-04-09 DIAGNOSIS — Z9181 History of falling: Secondary | ICD-10-CM | POA: Diagnosis not present

## 2020-04-09 DIAGNOSIS — R293 Abnormal posture: Secondary | ICD-10-CM | POA: Diagnosis not present

## 2020-04-14 DIAGNOSIS — R195 Other fecal abnormalities: Secondary | ICD-10-CM | POA: Diagnosis not present

## 2020-04-15 DIAGNOSIS — R293 Abnormal posture: Secondary | ICD-10-CM | POA: Diagnosis not present

## 2020-04-15 DIAGNOSIS — Z9181 History of falling: Secondary | ICD-10-CM | POA: Diagnosis not present

## 2020-04-15 DIAGNOSIS — M6281 Muscle weakness (generalized): Secondary | ICD-10-CM | POA: Diagnosis not present

## 2020-04-15 DIAGNOSIS — R531 Weakness: Secondary | ICD-10-CM | POA: Diagnosis not present

## 2020-04-28 ENCOUNTER — Other Ambulatory Visit: Payer: Self-pay

## 2020-04-28 NOTE — Progress Notes (Deleted)
86 y.o. Katie Rubio Married White or Caucasian Not Hispanic or Latino female here for annual exam.      Patient's last menstrual period was 11/14/1978 (approximate).          Sexually active: {yes no:314532}  The current method of family planning is {contraception:315051}.    Exercising: {yes no:314532}  {types:19826} Smoker:  {YES NO:22349}  Health Maintenance: Pap:04-06-15 Neg, 03-23-11 Neg History of abnormal Pap:no MMG:09/2008 3D/Density B/Neg/BiRads1, discussed screening Colonoscopy:01/2011 polyps;--PCP states no longer needed. BMD:02/2018 with Dr.Pharr--abnormal and suggested treatment--she has appointment to discuss TDaP:11/14/2013 Gardasil:no  reports that she quit smoking about 53 years ago. Her smoking use included cigarettes. She has a 1.25 pack-year smoking history. She has never used smokeless tobacco. She reports that she does not drink alcohol and does not use drugs.  Past Medical History:  Diagnosis Date  . Allergic rhinitis   . Atrophic vaginitis   . Benign head tremor   . Dizziness   . History of anxiety   . History of rheumatic fever   . Macrocytosis    History of chronic macrocytosis  . Meralgia paresthetica    History of  . Osteoporosis   . Palpitations    occasional  . Post-menopausal   . Sclerosis of the skin    lichen  . Splenic artery aneurysm ([place])   . Syncope   . Tinnitus    Chronic    Past Surgical History:  Procedure Laterality Date  . colon polyps      Current Outpatient Medications  Medication Sig Dispense Refill  . Bromfenac Sodium (PROLENSA) 0.07 % SOLN Apply to eye every 6 (six) months.    . Cholecalciferol (VITAMIN D) 2000 UNITS CAPS Take 2,000 Units by mouth every other day.     . clobetasol ointment (TEMOVATE) 0.05 % Apply a small amount topically BID for up to 2 weeks prn. Can use the cream 2 x a week baseline as needed. Not for daily long term use. 60 g 1  . Coenzyme Q10 (CO Q 10 PO) Take by mouth every other day.    .  Cyanocobalamin (VITAMIN B 12 PO) Take 1 tablet by mouth daily.    . Flaxseed, Linseed, (FLAXSEED OIL) 1000 MG CAPS Take by mouth daily.    . magnesium 30 MG tablet Take 30 mg by mouth daily.    . metoprolol tartrate (LOPRESSOR) 25 MG tablet Take 0.5 tablets (12.5 mg total) by mouth 2 (two) times daily. (Patient not taking: Reported on 01/31/2020) 60 tablet 1  . Nutritional Supplements (JUICE PLUS FIBRE PO) Take by mouth daily.    . Omega-3 Fatty Acids (FISH OIL PO) Take by mouth daily.      No current facility-administered medications for this visit.    Family History  Problem Relation Age of Onset  . Heart attack Father   . Cancer - Colon Mother        colon  . Cancer Son     Review of Systems  Exam:   LMP 11/14/1978 (Approximate)   Weight change: @WEIGHTCHANGE @ Height:      Ht Readings from Last 3 Encounters:  01/31/20 5\' 1"  (1.549 m)  01/03/20 5\' 1"  (1.549 m)  12/25/19 5\' 1"  (1.549 m)    General appearance: alert, cooperative and appears stated age Head: Normocephalic, without obvious abnormality, atraumatic Neck: no adenopathy, supple, symmetrical, trachea midline and thyroid {CHL AMB PHY EX THYROID NORM [phone]::"normal to inspection and palpation"} Lungs: clear to auscultation bilaterally Cardiovascular: regular rate and rhythm  Breasts: {Exam; breast:13139::"normal appearance, no masses or tenderness"} Abdomen: soft, non-tender; non distended,  no masses,  no organomegaly Extremities: extremities normal, atraumatic, no cyanosis or edema Skin: Skin color, texture, turgor normal. No rashes or lesions Lymph nodes: Cervical, supraclavicular, and axillary nodes normal. No abnormal inguinal nodes palpated Neurologic: Grossly normal   Pelvic: External genitalia:  no lesions              Urethra:  normal appearing urethra with no masses, tenderness or lesions              Bartholins and Skenes: normal                 Vagina: normal appearing vagina with normal  color and discharge, no lesions              Cervix: {CHL AMB PHY EX CERVIX NORM DEFAULT:[phone]::"no lesions"}               Bimanual Exam:  Uterus:  {CHL AMB PHY EX UTERUS NORM DEFAULT:[phone]::"normal size, contour, position, consistency, mobility, non-tender"}              Adnexa: {CHL AMB PHY EX ADNEXA NO MASS DEFAULT:[phone]::"no mass, fullness, tenderness"}               Rectovaginal: Confirms               Anus:  normal sphincter tone, no lesions  *** chaperoned for the exam.  A:  Well Woman with normal exam  P:

## 2020-04-29 ENCOUNTER — Ambulatory Visit: Payer: Medicare HMO | Admitting: Obstetrics and Gynecology

## 2020-05-15 ENCOUNTER — Other Ambulatory Visit: Payer: Self-pay | Admitting: Cardiology

## 2020-06-29 DIAGNOSIS — N3944 Nocturnal enuresis: Secondary | ICD-10-CM | POA: Diagnosis not present

## 2020-06-29 DIAGNOSIS — M810 Age-related osteoporosis without current pathological fracture: Secondary | ICD-10-CM | POA: Diagnosis not present

## 2020-06-29 DIAGNOSIS — K5900 Constipation, unspecified: Secondary | ICD-10-CM | POA: Diagnosis not present

## 2020-06-29 DIAGNOSIS — G8929 Other chronic pain: Secondary | ICD-10-CM | POA: Diagnosis not present

## 2020-06-29 DIAGNOSIS — I499 Cardiac arrhythmia, unspecified: Secondary | ICD-10-CM | POA: Diagnosis not present

## 2020-06-29 DIAGNOSIS — Z79899 Other long term (current) drug therapy: Secondary | ICD-10-CM | POA: Diagnosis not present

## 2020-06-29 DIAGNOSIS — G4700 Insomnia, unspecified: Secondary | ICD-10-CM | POA: Diagnosis not present

## 2020-06-29 DIAGNOSIS — Z809 Family history of malignant neoplasm, unspecified: Secondary | ICD-10-CM | POA: Diagnosis not present

## 2020-06-29 DIAGNOSIS — I10 Essential (primary) hypertension: Secondary | ICD-10-CM | POA: Diagnosis not present

## 2020-06-29 DIAGNOSIS — N393 Stress incontinence (female) (male): Secondary | ICD-10-CM | POA: Diagnosis not present

## 2020-07-15 DIAGNOSIS — R69 Illness, unspecified: Secondary | ICD-10-CM | POA: Diagnosis not present
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
## 2020-07-29 ENCOUNTER — Ambulatory Visit (INDEPENDENT_AMBULATORY_CARE_PROVIDER_SITE_OTHER): Payer: Medicare HMO | Admitting: Obstetrics and Gynecology

## 2020-07-29 ENCOUNTER — Other Ambulatory Visit: Payer: Self-pay

## 2020-07-29 ENCOUNTER — Encounter: Payer: Self-pay | Admitting: Obstetrics and Gynecology

## 2020-07-29 VITALS — BP 142/80 | HR 81 | Ht 60.75 in | Wt 118.0 lb

## 2020-07-29 DIAGNOSIS — N3941 Urge incontinence: Secondary | ICD-10-CM

## 2020-07-29 DIAGNOSIS — L900 Lichen sclerosus et atrophicus: Secondary | ICD-10-CM

## 2020-07-29 DIAGNOSIS — Z01419 Encounter for gynecological examination (general) (routine) without abnormal findings: Secondary | ICD-10-CM

## 2020-07-29 DIAGNOSIS — Z01411 Encounter for gynecological examination (general) (routine) with abnormal findings: Secondary | ICD-10-CM

## 2020-07-29 DIAGNOSIS — L309 Dermatitis, unspecified: Secondary | ICD-10-CM | POA: Diagnosis not present

## 2020-07-29 MED ORDER — CLOBETASOL PROPIONATE 0.05 % EX OINT: TOPICAL_OINTMENT | CUTANEOUS | 1 refills | Status: DC

## 2020-07-29 NOTE — Patient Instructions (Addendum)
EXERCISE AND DIET:  We recommended that you start or continue a regular exercise program for good health. Regular exercise means any activity that makes your heart beat faster and makes you sweat.  We recommend exercising at least 30 minutes per day at least 3 days a week, preferably 4 or 5.  We also recommend a diet low in fat and sugar.  Inactivity, poor dietary choices and obesity can cause diabetes, heart attack, stroke, and kidney damage, among others.    ALCOHOL AND SMOKING:  Women should limit their alcohol intake to no more than 7 drinks/beers/glasses of wine (combined, not each!) per week. Moderation of alcohol intake to this level decreases your risk of breast cancer and liver damage. And of course, no recreational drugs are part of a healthy lifestyle.  And absolutely no smoking or even second hand smoke. Most people know smoking can cause heart and lung diseases, but did you know it also contributes to weakening of your bones? Aging of your skin?  Yellowing of your teeth and nails?  CALCIUM AND VITAMIN D:  Adequate intake of calcium and Vitamin D are recommended.  The recommendations for exact amounts of these supplements seem to change often, but generally speaking 1,200 mg of calcium (between diet and supplement) and 800 units of Vitamin D per day seems prudent. Certain women may benefit from higher intake of Vitamin D.  If you are among these women, your doctor will have told you during your visit.    PAP SMEARS:  Pap smears, to check for cervical cancer or precancers,  have traditionally been done yearly, although recent scientific advances have shown that most women can have pap smears less often.  However, every woman still should have a physical exam from her gynecologist every year. It will include a breast check, inspection of the vulva and vagina to check for abnormal growths or skin changes, a visual exam of the cervix, and then an exam to evaluate the size and shape of the uterus and  ovaries.  And after 84 years of age, a rectal exam is indicated to check for rectal cancers. We will also provide age appropriate advice regarding health maintenance, like when you should have certain vaccines, screening for sexually transmitted diseases, bone density testing, colonoscopy, mammograms, etc.   MAMMOGRAMS:  All women over 40 years old should have a yearly mammogram. Many facilities now offer a "3D" mammogram, which may cost around $50 extra out of pocket. If possible,  we recommend you accept the option to have the 3D mammogram performed.  It both reduces the number of women who will be called back for extra views which then turn out to be normal, and it is better than the routine mammogram at detecting truly abnormal areas.    COLON CANCER SCREENING: Now recommend starting at age 45. At this time colonoscopy is not covered for routine screening until 50. There are take home tests that can be done between 45-49.   COLONOSCOPY:  Colonoscopy to screen for colon cancer is recommended for all women at age 50.  We know, you hate the idea of the prep.  We agree, BUT, having colon cancer and not knowing it is worse!!  Colon cancer so often starts as a polyp that can be seen and removed at colonscopy, which can quite literally save your life!  And if your first colonoscopy is normal and you have no family history of colon cancer, most women don't have to have it again for   10 years.  Once every ten years, you can do something that may end up saving your life, right?  We will be happy to help you get it scheduled when you are ready.  Be sure to check your insurance coverage so you understand how much it will cost.  It may be covered as a preventative service at no cost, but you should check your particular policy.      Breast Self-Awareness Breast self-awareness means being familiar with how your breasts look and feel. It involves checking your breasts regularly and reporting any changes to your  health care provider. Practicing breast self-awareness is important. A change in your breasts can be a sign of a serious medical problem. Being familiar with how your breasts look and feel allows you to find any problems early, when treatment is more likely to be successful. All women should practice breast self-awareness, including women who have had breast implants. How to do a breast self-exam One way to learn what is normal for your breasts and whether your breasts are changing is to do a breast self-exam. To do a breast self-exam: Look for Changes  1. Remove all the clothing above your waist. 2. Stand in front of a mirror in a room with good lighting. 3. Put your hands on your hips. 4. Push your hands firmly downward. 5. Compare your breasts in the mirror. Look for differences between them (asymmetry), such as: ? Differences in shape. ? Differences in size. ? Puckers, dips, and bumps in one breast and not the other. 6. Look at each breast for changes in your skin, such as: ? Redness. ? Scaly areas. 7. Look for changes in your nipples, such as: ? Discharge. ? Bleeding. ? Dimpling. ? Redness. ? A change in position. Feel for Changes Carefully feel your breasts for lumps and changes. It is best to do this while lying on your back on the floor and again while sitting or standing in the shower or tub with soapy water on your skin. Feel each breast in the following way:  Place the arm on the side of the breast you are examining above your head.  Feel your breast with the other hand.  Start in the nipple area and make  inch (2 cm) overlapping circles to feel your breast. Use the pads of your three middle fingers to do this. Apply light pressure, then medium pressure, then firm pressure. The light pressure will allow you to feel the tissue closest to the skin. The medium pressure will allow you to feel the tissue that is a little deeper. The firm pressure will allow you to feel the tissue  close to the ribs.  Continue the overlapping circles, moving downward over the breast until you feel your ribs below your breast.  Move one finger-width toward the center of the body. Continue to use the  inch (2 cm) overlapping circles to feel your breast as you move slowly up toward your collarbone.  Continue the up and down exam using all three pressures until you reach your armpit.  Write Down What You Find  Write down what is normal for each breast and any changes that you find. Keep a written record with breast changes or normal findings for each breast. By writing this information down, you do not need to depend only on memory for size, tenderness, or location. Write down where you are in your menstrual cycle, if you are still menstruating. If you are having trouble noticing differences   in your breasts, do not get discouraged. With time you will become more familiar with the variations in your breasts and more comfortable with the exam. How often should I examine my breasts? Examine your breasts every month. If you are breastfeeding, the best time to examine your breasts is after a feeding or after using a breast pump. If you menstruate, the best time to examine your breasts is 5-7 days after your period is over. During your period, your breasts are lumpier, and it may be more difficult to notice changes. When should I see my health care provider? See your health care provider if you notice:  A change in shape or size of your breasts or nipples.  A change in the skin of your breast or nipples, such as a reddened or scaly area.  Unusual discharge from your nipples.  A lump or thick area that was not there before.  Pain in your breasts.  Anything that concerns you.  Urinary Incontinence  Urinary incontinence refers to a condition in which a person is unable to control where and when to pass urine. A person with this condition will urinate when he or she does not mean to  (involuntarily). What are the causes? This condition may be caused by:  Medicines.  Infections.  Constipation.  Overactive bladder muscles.  Weak bladder muscles.  Weak pelvic floor muscles. These muscles provide support for the bladder, intestine, and, in women, the uterus.  Enlarged prostate in men. The prostate is a gland near the bladder. When it gets too big, it can pinch the urethra. With the urethra blocked, the bladder can weaken and lose the ability to empty properly.  Surgery.  Emotional factors, such as anxiety, stress, or post-traumatic stress disorder (PTSD).  Pelvic organ prolapse. This happens in women when organs shift out of place and into the vagina. This shift can prevent the bladder and urethra from working properly. What increases the risk? The following factors may make you more likely to develop this condition:  Older age.  Obesity and physical inactivity.  Pregnancy and childbirth.  Menopause.  Diseases that affect the nerves or spinal cord (neurological diseases).  Long-term (chronic) coughing. This can increase pressure on the bladder and pelvic floor muscles. What are the signs or symptoms? Symptoms may vary depending on the type of urinary incontinence you have. They include:  A sudden urge to urinate, but passing urine involuntarily before you can get to a bathroom (urge incontinence).  Suddenly passing urine with any activity that forces urine to pass, such as coughing, laughing, exercise, or sneezing (stress incontinence).  Needing to urinate often, but urinating only a small amount, or constantly dribbling urine (overflow incontinence).  Urinating because you cannot get to the bathroom in time due to a physical disability, such as arthritis or injury, or communication and thinking problems, such as Alzheimer disease (functional incontinence). How is this diagnosed? This condition may be diagnosed based on:  Your medical history.  A  physical exam.  Tests, such as: ? Urine tests. ? X-rays of your kidney and bladder. ? Ultrasound. ? CT scan. ? Cystoscopy. In this procedure, a health care provider inserts a tube with a light and camera (cystoscope) through the urethra and into the bladder in order to check for problems. ? Urodynamic testing. These tests assess how well the bladder, urethra, and sphincter can store and release urine. There are different types of urodynamic tests, and they vary depending on what the test is measuring.  To help diagnose your condition, your health care provider may recommend that you keep a log of when you urinate and how much you urinate. How is this treated? Treatment for this condition depends on the type of incontinence that you have and its cause. Treatment may include:  Lifestyle changes, such as: ? Quitting smoking. ? Maintaining a healthy weight. ? Staying active. Try to get 150 minutes of moderate-intensity exercise every week. Ask your health care provider which activities are safe for you. ? Eating a healthy diet.  Avoid high-fat foods, like fried foods.  Avoid refined carbohydrates like white bread and white rice.  Limit how much alcohol and caffeine you drink.  Increase your fiber intake. Foods such as fresh fruits, vegetables, beans, and whole grains are healthy sources of fiber.  Pelvic floor muscle exercises.  Bladder training, such as lengthening the amount of time between bathroom breaks, or using the bathroom at regular intervals.  Using techniques to suppress bladder urges. This can include distraction techniques or controlled breathing exercises.  Medicines to relax the bladder muscles and prevent bladder spasms.  Medicines to help slow or prevent the growth of a man's prostate.  Botox injections. These can help relax the bladder muscles.  Using pulses of electricity to help change bladder reflexes (electrical nerve stimulation).  For women, using a medical  device to prevent urine leaks. This is a small, tampon-like, disposable device that is inserted into the urethra.  Injecting collagen or carbon beads (bulking agents) into the urinary sphincter. These can help thicken tissue and close the bladder opening.  Surgery. Follow these instructions at home: Lifestyle  Limit alcohol and caffeine. These can fill your bladder quickly and irritate it.  Keep yourself clean to help prevent odors and skin damage. Ask your doctor about special skin creams and cleansers that can protect the skin from urine.  Consider wearing pads or adult diapers. Make sure to change them regularly, and always change them right after experiencing incontinence. General instructions  Take over-the-counter and prescription medicines only as told by your health care provider.  Use the bathroom about every 3-4 hours, even if you do not feel the need to urinate. Try to empty your bladder completely every time. After urinating, wait a minute. Then try to urinate again.  Make sure you are in a relaxed position while urinating.  If your incontinence is caused by nerve problems, keep a log of the medicines you take and the times you go to the bathroom.  Keep all follow-up visits as told by your health care provider. This is important. Contact a health care provider if:  You have pain that gets worse.  Your incontinence gets worse. Get help right away if:  You have a fever or chills.  You are unable to urinate.  You have redness in your groin area or down your legs. Summary  Urinary incontinence refers to a condition in which a person is unable to control where and when to pass urine.  This condition may be caused by medicines, infection, weak bladder muscles, weak pelvic floor muscles, enlargement of the prostate (in men), or surgery.  The following factors increase your risk for developing this condition: older age, obesity, pregnancy and childbirth, menopause,  neurological diseases, and chronic coughing.  There are several types of urinary incontinence. They include urge incontinence, stress incontinence, overflow incontinence, and functional incontinence.  This condition is usually treated first with lifestyle and behavioral changes, such as quitting smoking, eating a healthier  diet, and doing regular pelvic floor exercises. Other treatment options include medicines, bulking agents, medical devices, electrical nerve stimulation, or surgery. This information is not intended to replace advice given to you by your health care provider. Make sure you discuss any questions you have with your health care provider. Document Revised: 11/10/2017 Document Reviewed: 02/09/2017 Elsevier Patient Education  [place].

## 2020-07-30 ENCOUNTER — Ambulatory Visit: Payer: Medicare HMO | Admitting: Cardiology

## 2020-07-30 ENCOUNTER — Encounter: Payer: Self-pay | Admitting: Cardiology

## 2020-07-30 VITALS — BP 150/70 | HR 74 | Resp 16 | Ht 65.25 in | Wt 114.9 lb

## 2020-07-30 DIAGNOSIS — I10 Essential (primary) hypertension: Secondary | ICD-10-CM

## 2020-07-30 DIAGNOSIS — Z8679 Personal history of other diseases of the circulatory system: Secondary | ICD-10-CM

## 2020-07-30 DIAGNOSIS — I472 Ventricular tachycardia: Secondary | ICD-10-CM | POA: Diagnosis not present

## 2020-07-30 DIAGNOSIS — I491 Atrial premature depolarization: Secondary | ICD-10-CM | POA: Diagnosis not present

## 2020-07-30 DIAGNOSIS — Z87891 Personal history of nicotine dependence: Secondary | ICD-10-CM

## 2020-07-30 DIAGNOSIS — I4729 Other ventricular tachycardia: Secondary | ICD-10-CM

## 2020-07-30 NOTE — Progress Notes (Signed)
Primary Physician:  Deland Pretty, MD   Patient ID: Katie Rubio, female    DOB: Jun 19, 1929, 86 y.o.   MRN: [id number]   Date: 07/30/20 Last Office Visit: 01/31/2020  Subjective:   Chief Complaint  Patient presents with  . Hypertension  . Follow-up    6 month    HPI: [name]  is a 86 y.o. Caucasian female  with splenic artery aneurysm, spinal stenosis, DDD, rheumatic fever at age 67, history of PACs, history of asymptomatic nonsustained ventricular tachycardia, hypertension presents to the office with a chief complaint of  " follow-up on blood pressure."  Patient is accompanied by her daughter today's office visit.  In the recent past patient was having symptoms of dizziness which was predominately felt to be due to her vertigo but because of occasional palpitations she underwent a 30-day event monitor and echocardiogram.  The event monitor noted several episodes of nonsustained ventricular tachycardia.  Subsequently underwent echocardiogram and stress test.  Echocardiogram noted preserved LVEF without any significant valvular heart disease and stress test was reported to be overall low risk study.    Given the history of asymptomatic NSVT with an unremarkable ischemic work-up and underlying hypertension patient was recommended to start Lopressor 12.5 mg p.o. twice daily.  Patient has tolerated the medication well since last office visit without any side effects or intolerances.    Patient's daughter notes that her home blood pressure usually ranges between 130-140 mmHg.  No recurrence of dizziness or palpitations.  No syncope.  She is overall feeling well.  No history of hyperlipidemia, diabetes, or thyroid disorders.   She continues to live at home with her husband, and does most of her household chores.     Past Medical History:  Diagnosis Date  . Allergic rhinitis   . Atrophic vaginitis   . Benign head tremor   . Dizziness   . History of anxiety   .  History of rheumatic fever   . Macrocytosis    History of chronic macrocytosis  . Meralgia paresthetica    History of  . Osteoporosis   . Palpitations    occasional  . Post-menopausal   . Sclerosis of the skin    lichen  . Splenic artery aneurysm ([place])   . Syncope   . Tinnitus    Chronic    Past Surgical History:  Procedure Laterality Date  . colon polyps     Social History   Tobacco Use  . Smoking status: Former Smoker    Packs/day: 0.25    Years: 5.00    Pack years: 1.25    Types: Cigarettes    Quit date: 1968    Years since quitting: 53.7  . Smokeless tobacco: Never Used  . Tobacco comment: Pt was a some day smoker.  Vaping Use  . Vaping Use: Never used  Substance Use Topics  . Alcohol use: No    Alcohol/week: 0.0 standard drinks  . Drug use: No     Review of Systems  Constitutional: Negative for decreased appetite, malaise/fatigue, weight gain and weight loss.  Eyes: Negative for visual disturbance.  Cardiovascular: Negative for chest pain, claudication, dyspnea on exertion, leg swelling, orthopnea, palpitations and syncope.  Respiratory: Negative for hemoptysis and wheezing.   Endocrine: Negative for cold intolerance and heat intolerance.  Hematologic/Lymphatic: Does not bruise/bleed easily.  Skin: Negative for nail changes.  Musculoskeletal: Positive for back pain. Negative for falls, muscle weakness and myalgias.  Gastrointestinal: Negative for abdominal pain,  change in bowel habit, nausea and vomiting.  Neurological: Positive for vertigo. Negative for difficulty with concentration, dizziness, focal weakness and headaches.  Psychiatric/Behavioral: Negative for altered mental status and suicidal ideas.  All other systems reviewed and are negative.     Objective:  Blood pressure (!) 150/70, pulse 74, resp. rate 16, height 5' 5.25" (1.657 m), weight 114 lb 14.4 oz (52.1 kg), last menstrual period 11/14/1978, SpO2 96 %. Body mass index is 18.97 kg/m.     Physical Exam Vitals reviewed.  Constitutional:      Appearance: She is well-developed.  HENT:     Head: Normocephalic and atraumatic.  Cardiovascular:     Rate and Rhythm: Normal rate and regular rhythm. Occasional extrasystoles are present.    Pulses: Intact distal pulses.     Heart sounds: Normal heart sounds.  Pulmonary:     Effort: Pulmonary effort is normal. No accessory muscle usage or respiratory distress.     Breath sounds: Normal breath sounds.  Abdominal:     General: Bowel sounds are normal.     Palpations: Abdomen is soft.  Musculoskeletal:        General: Normal range of motion.     Cervical back: Normal range of motion.  Skin:    General: Skin is warm and dry.  Neurological:     Mental Status: She is alert and oriented to person, place, and time.     Laboratory examination:   01/04/2019: Glucose 95, BUN/Cr 29/1.0, eGFR 54.4, Na/K 142/4.8, AST 25, ALT 23, Alk Phos 61. Rest of CMP normal.  H/H 11.6/36.9, MCV 96.9, Platelets 167, WBD 4.4, RBC 3.81, Rest of CBC normal.  Cholesterol 205, TG 48, HDL 73, calc LDL 122. Vitamin D25 41.  No flowsheet data found. No flowsheet data found. Lipid Panel  No results found for: CHOL, TRIG, HDL, CHOLHDL, VLDL, LDLCALC, LDLDIRECT HEMOGLOBIN A1C No results found for: HGBA1C, MPG TSH No results for input(s): TSH in the last 8760 hours.  PRN Meds:. There are no discontinued medications. Current Meds  Medication Sig  . Cholecalciferol (VITAMIN D) 2000 UNITS CAPS Take 2,000 Units by mouth every other day.   . clobetasol ointment (TEMOVATE) 0.05 % Apply a small amount topically BID for up to 2 weeks prn. Can use the cream 2 x a week baseline as needed. Not for daily long term use.  . Coenzyme Q10 (CO Q 10 PO) Take by mouth every other day.  . Cyanocobalamin (VITAMIN B 12 PO) Take 1 tablet by mouth daily.  . Flaxseed, Linseed, (FLAXSEED OIL) 1000 MG CAPS Take by mouth daily.  . magnesium 30 MG tablet Take 30 mg by mouth daily.   . metoprolol tartrate (LOPRESSOR) 25 MG tablet TAKE (1/2) TABLET TWICE DAILY.  Marland Kitchen Nutritional Supplements (JUICE PLUS FIBRE PO) Take by mouth daily.  . Omega-3 Fatty Acids (FISH OIL PO) Take by mouth daily.   . Turmeric (QC TUMERIC COMPLEX) 500 MG CAPS Take by mouth.  Marland Kitchen UNABLE TO FIND astragulus    Cardiac Studies:   Echocardiogram 12/12/2019:  Normal LV systolic function with visual EF 55-60%. Left ventricle cavity  is normal in size. Normal global wall motion. Doppler evidence of grade I  (impaired) diastolic dysfunction, normal LAP.  Structurally normal tricuspid valve with trace regurgitation. No evidence  of pulmonary hypertension.  Stress test: Lexiscan (Walking with mod Bruce)Tetrofosmin Stress Test  01/13/2020: Nondiagnostic ECG stress. Myocardial perfusion is normal. Overall LV systolic function is normal without regional wall motion abnormalities.  Stress LV EF: 62%.  No previous exam available for comparison.  Vascular US LE 05/12/2011: No evidence of hemodynamically significant lower extremity arterial occlusive disease at rest  Assessment:     ICD-10-CM   1. NSVT (nonsustained ventricular tachycardia) (HCC)  I47.2   2. Essential hypertension  I10 EKG 12-Lead  3. Premature atrial complex  I49.1   4. History of rheumatic fever  Z86.79   5. Former smoker  Z87.891     EKG 12/05/2019: Normal sinus rhythm/arrhythmia at 85 bpm with 1 PAC's, left atrial enlargement, normal axis, no evidence of ischemia. No changes from EKG 07/31/2019.  EKG 07/30/2020: Normal sinus rhythm with sinus arrhythmia, 69 bpm, normal axis, without underlying ischemia or injury pattern.  Recommendations:  Nonsustained ventricular tachycardia:  Noted on 30-day event monitor.  Has undergone an ischemic evaluation which has been unremarkable.  Started on Lopressor 12.5 mg p.o. twice daily given her history of NSVT and hypertension.  Tolerated the medication well without any side effects or  intolerances.  Essential hypertension: . Office blood pressure is at not goal. Home blood pressure according to patient's daughter ranges between 130-140 mmHg.   . Medication reconciled. . Recommended transition to Toprol-XL 25 mg p.o. daily.  Patient states that she will discuss this further with her PCP prior to medication changes. . Low salt diet recommended. A diet that is rich in fruits, vegetables, legumes, and low-fat dairy products and low in snacks, sweets, and meats (such as the Dietary Approaches to Stop Hypertension [DASH] diet).   Former smoker: Educated on the importance of continued smoking cessation.  --Continue cardiac medications as reconciled in final medication list. --Return in about 6 months (around 01/27/2021) for Reevaluation of  NSVT and BP. . Or sooner if needed. --Continue follow-up with your primary care physician regarding the management of your other chronic comorbid conditions.  Patient's questions and concerns were addressed to her satisfaction. She voices understanding of the instructions provided during this encounter.   This note was created using a voice recognition software as a result there may be grammatical errors inadvertently enclosed that do not reflect the nature of this encounter. Every attempt is made to correct such errors.  Rex Kras, [place], [facility]  Pager: [phone] Office: [phone]

## 2020-08-05 ENCOUNTER — Ambulatory Visit: Payer: Medicare HMO | Admitting: Cardiology

## 2020-09-04 DIAGNOSIS — Z23 Encounter for immunization: Secondary | ICD-10-CM | POA: Diagnosis not present

## 2020-09-11 DIAGNOSIS — R300 Dysuria: Secondary | ICD-10-CM | POA: Diagnosis not present

## 2020-09-14 ENCOUNTER — Telehealth: Payer: Self-pay

## 2020-09-14 NOTE — Telephone Encounter (Signed)
She can take the antibiotic for UTI.

## 2020-09-14 NOTE — Telephone Encounter (Signed)
Patient got diagnose with a UTI she was started on nitrofurantoin patient has irregular heartbeat and she was wondering if she would be fine taking that antibiotic please advise

## 2020-09-14 NOTE — Telephone Encounter (Signed)
I spoke to patient she is aware

## 2020-09-30 DIAGNOSIS — N1831 Chronic kidney disease, stage 3a: Secondary | ICD-10-CM | POA: Diagnosis not present

## 2020-09-30 DIAGNOSIS — R300 Dysuria: Secondary | ICD-10-CM | POA: Diagnosis not present

## 2020-10-06 DIAGNOSIS — M810 Age-related osteoporosis without current pathological fracture: Secondary | ICD-10-CM | POA: Diagnosis not present

## 2020-10-22 DIAGNOSIS — R69 Illness, unspecified: Secondary | ICD-10-CM | POA: Diagnosis not present

## 2020-10-28 DIAGNOSIS — M2042 Other hammer toe(s) (acquired), left foot: Secondary | ICD-10-CM | POA: Diagnosis not present

## 2020-10-28 DIAGNOSIS — M79675 Pain in left toe(s): Secondary | ICD-10-CM | POA: Diagnosis not present

## 2020-10-28 DIAGNOSIS — L84 Corns and callosities: Secondary | ICD-10-CM | POA: Diagnosis not present

## 2020-10-28 DIAGNOSIS — M21962 Unspecified acquired deformity of left lower leg: Secondary | ICD-10-CM | POA: Diagnosis not present

## 2020-12-12 DIAGNOSIS — I129 Hypertensive chronic kidney disease with stage 1 through stage 4 chronic kidney disease, or unspecified chronic kidney disease: Secondary | ICD-10-CM | POA: Diagnosis not present

## 2020-12-12 DIAGNOSIS — M810 Age-related osteoporosis without current pathological fracture: Secondary | ICD-10-CM | POA: Diagnosis not present

## 2020-12-12 DIAGNOSIS — N1831 Chronic kidney disease, stage 3a: Secondary | ICD-10-CM | POA: Diagnosis not present

## 2020-12-23 DIAGNOSIS — L853 Xerosis cutis: Secondary | ICD-10-CM | POA: Diagnosis not present

## 2020-12-23 DIAGNOSIS — L578 Other skin changes due to chronic exposure to nonionizing radiation: Secondary | ICD-10-CM | POA: Diagnosis not present

## 2020-12-23 DIAGNOSIS — L821 Other seborrheic keratosis: Secondary | ICD-10-CM | POA: Diagnosis not present

## 2020-12-23 DIAGNOSIS — L219 Seborrheic dermatitis, unspecified: Secondary | ICD-10-CM | POA: Diagnosis not present

## 2020-12-23 DIAGNOSIS — D225 Melanocytic nevi of trunk: Secondary | ICD-10-CM | POA: Diagnosis not present

## 2020-12-23 DIAGNOSIS — L570 Actinic keratosis: Secondary | ICD-10-CM | POA: Diagnosis not present

## 2020-12-23 DIAGNOSIS — L814 Other melanin hyperpigmentation: Secondary | ICD-10-CM | POA: Diagnosis not present

## 2020-12-23 DIAGNOSIS — L304 Erythema intertrigo: Secondary | ICD-10-CM | POA: Diagnosis not present

## 2021-01-11 DIAGNOSIS — M810 Age-related osteoporosis without current pathological fracture: Secondary | ICD-10-CM | POA: Diagnosis not present

## 2021-01-11 DIAGNOSIS — N1831 Chronic kidney disease, stage 3a: Secondary | ICD-10-CM | POA: Diagnosis not present

## 2021-01-11 DIAGNOSIS — I129 Hypertensive chronic kidney disease with stage 1 through stage 4 chronic kidney disease, or unspecified chronic kidney disease: Secondary | ICD-10-CM | POA: Diagnosis not present

## 2021-01-20 DIAGNOSIS — Z7982 Long term (current) use of aspirin: Secondary | ICD-10-CM | POA: Diagnosis not present

## 2021-01-20 DIAGNOSIS — I10 Essential (primary) hypertension: Secondary | ICD-10-CM | POA: Diagnosis not present

## 2021-01-20 DIAGNOSIS — M810 Age-related osteoporosis without current pathological fracture: Secondary | ICD-10-CM | POA: Diagnosis not present

## 2021-01-25 ENCOUNTER — Ambulatory Visit: Payer: Medicare HMO | Admitting: Cardiology

## 2021-01-25 DIAGNOSIS — M79672 Pain in left foot: Secondary | ICD-10-CM | POA: Diagnosis not present

## 2021-01-25 DIAGNOSIS — Z0001 Encounter for general adult medical examination with abnormal findings: Secondary | ICD-10-CM | POA: Diagnosis not present

## 2021-01-25 DIAGNOSIS — M810 Age-related osteoporosis without current pathological fracture: Secondary | ICD-10-CM | POA: Diagnosis not present

## 2021-01-25 DIAGNOSIS — D72819 Decreased white blood cell count, unspecified: Secondary | ICD-10-CM | POA: Diagnosis not present

## 2021-01-25 DIAGNOSIS — Z0000 Encounter for general adult medical examination without abnormal findings: Secondary | ICD-10-CM | POA: Diagnosis not present

## 2021-01-25 DIAGNOSIS — I472 Ventricular tachycardia: Secondary | ICD-10-CM | POA: Diagnosis not present

## 2021-01-25 DIAGNOSIS — J309 Allergic rhinitis, unspecified: Secondary | ICD-10-CM | POA: Diagnosis not present

## 2021-01-25 DIAGNOSIS — N1831 Chronic kidney disease, stage 3a: Secondary | ICD-10-CM | POA: Diagnosis not present

## 2021-01-25 DIAGNOSIS — I728 Aneurysm of other specified arteries: Secondary | ICD-10-CM | POA: Diagnosis not present

## 2021-01-25 DIAGNOSIS — Z7982 Long term (current) use of aspirin: Secondary | ICD-10-CM | POA: Diagnosis not present

## 2021-01-25 DIAGNOSIS — K5900 Constipation, unspecified: Secondary | ICD-10-CM | POA: Diagnosis not present

## 2021-01-27 ENCOUNTER — Ambulatory Visit: Payer: Medicare HMO | Admitting: Cardiology

## 2021-01-28 DIAGNOSIS — D492 Neoplasm of unspecified behavior of bone, soft tissue, and skin: Secondary | ICD-10-CM | POA: Diagnosis not present

## 2021-01-28 DIAGNOSIS — M79671 Pain in right foot: Secondary | ICD-10-CM | POA: Diagnosis not present

## 2021-01-28 DIAGNOSIS — M2042 Other hammer toe(s) (acquired), left foot: Secondary | ICD-10-CM | POA: Diagnosis not present

## 2021-01-28 DIAGNOSIS — I739 Peripheral vascular disease, unspecified: Secondary | ICD-10-CM | POA: Diagnosis not present

## 2021-01-28 DIAGNOSIS — M2041 Other hammer toe(s) (acquired), right foot: Secondary | ICD-10-CM | POA: Diagnosis not present

## 2021-01-28 DIAGNOSIS — M79672 Pain in left foot: Secondary | ICD-10-CM | POA: Diagnosis not present

## 2021-02-01 DIAGNOSIS — H5200 Hypermetropia, unspecified eye: Secondary | ICD-10-CM | POA: Diagnosis not present

## 2021-02-01 DIAGNOSIS — Z961 Presence of intraocular lens: Secondary | ICD-10-CM | POA: Diagnosis not present

## 2021-02-11 ENCOUNTER — Ambulatory Visit: Payer: Medicare HMO | Admitting: Gastroenterology

## 2021-02-11 VITALS — BP 160/80 | Ht 59.5 in | Wt 118.0 lb

## 2021-02-11 DIAGNOSIS — K5909 Other constipation: Secondary | ICD-10-CM

## 2021-02-11 DIAGNOSIS — L290 Pruritus ani: Secondary | ICD-10-CM | POA: Diagnosis not present

## 2021-02-11 MED ORDER — POLYETHYLENE GLYCOL 3350 17 G PO PACK: 17.0000 g | PACK | Freq: Every day | ORAL | 0 refills | Status: AC

## 2021-02-11 NOTE — Progress Notes (Signed)
HPI :  87 year old female with a history of chronic constipation, CKD, history of melanoma, referred by Dr. Deland Pretty for chronic constipation.  Patient states she has had chronic constipation since childhood.  Recalls having enemas as a child.  She states in general she has suffered from this over the years but seems to be a bit worse in recent years.  She specifically has a harder time expelling stool from her rectum.  States she strains a lot to get the stool out.  When asked about stool form, she states it solid but unclear if this is hard or soft.  She does pass stool every day.  No blood in her stools.  She denies any abdominal pains with this.  She takes a magnesium supplement at night which she states helps with her symptoms.  She states when she does go she does have a sense that she completely evacuates.  Her mother had colon cancer diagnosed at age 31.  Her last colonoscopy was in March 2012 at Dr. Perley Jain office.  She states she may have had a few polyps removed but was told she did not need any more exams.  Her labs are as outlined below, no history of anemia.  She denies any history of radiation or major pelvic surgeries.  She does have some pruritus in her anal area at times and often uses wet wipes to clean herself.  She inquires about ways to manage that.  Her daughter is present for the visit today.  They state they are interested in a virtual colonoscopy and previously tried to do this within the past year but their insurance declined it.  Colonoscopy 02/03/2011 - Mr. Watt Climes - no records available  Labs 01/20/21: WBC 4.4, Hgb 12.1, HCT 39.2, plt 170, MCV 97  BUN 29, Cr 1.0 T bil 0.5, AST 42, ALT 32, AP 47    Past Medical History:  Diagnosis Date  . Allergic rhinitis   . Anxiety   . Atrophic vaginitis   . Benign head tremor   . CKD (chronic kidney disease) stage 3, GFR 30-59 ml/min (HCC)   . Dizziness   . Elevated serum glutamic pyruvic transaminase (SGPT) level   .  History of anxiety   . History of rheumatic fever   . Hypertension   . Lung nodule   . Macrocytosis    History of chronic macrocytosis  . Melanoma ([place])   . Meralgia paresthetica    History of  . NSVT (nonsustained ventricular tachycardia) ([place])   . Osteoporosis   . Palpitations    occasional  . Post-menopausal   . Scarlet fever   . Sclerosis of the skin    lichen  . Splenic artery aneurysm ([place])   . Syncope   . Tinnitus    Chronic     Past Surgical History:  Procedure Laterality Date  . CATARACT EXTRACTION Bilateral   . DENTAL SURGERY    . SKIN CANCER EXCISION    . TONSILLECTOMY     Family History  Problem Relation Age of Onset  . Heart attack Father   . Cancer - Colon Mother 70  . Hypertension Mother   . Heart disease Sister   . Cancer Son        muscle   Social History   Tobacco Use  . Smoking status: Former Smoker    Packs/day: 0.25    Years: 5.00    Pack years: 1.25    Types: Cigarettes    Quit  date: 86    Years since quitting: 54.2  . Smokeless tobacco: Never Used  . Tobacco comment: Pt was a some day smoker.  Vaping Use  . Vaping Use: Never used  Substance Use Topics  . Alcohol use: No    Alcohol/week: 0.0 standard drinks  . Drug use: No   Current Outpatient Medications  Medication Sig Dispense Refill  . Cholecalciferol (VITAMIN D) 2000 UNITS CAPS Take 2,000 Units by mouth every other day.     . clobetasol ointment (TEMOVATE) 0.05 % Apply a small amount topically BID for up to 2 weeks prn. Can use the cream 2 x a week baseline as needed. Not for daily long term use. 60 g 1  . Coenzyme Q10 (CO Q 10 PO) Take by mouth every other day.    . Cyanocobalamin (VITAMIN B 12 PO) Take 1 tablet by mouth daily.    Marland Kitchen denosumab (PROLIA) 60 MG/ML SOSY injection 60 mg    . Flaxseed, Linseed, (FLAXSEED OIL) 1000 MG CAPS Take by mouth daily.    . Grape Seed 50 MG TABS as needed.    . magnesium 30 MG tablet Take 30 mg by mouth daily.    . metoprolol tartrate  (LOPRESSOR) 25 MG tablet TAKE (1/2) TABLET TWICE DAILY. 60 tablet 0  . Nutritional Supplements (JUICE PLUS FIBRE PO) Take by mouth daily.    . Omega-3 Fatty Acids (FISH OIL PO) Take by mouth daily.     . Turmeric 500 MG CAPS Take by mouth.    Marland Kitchen UNABLE TO FIND astragulus    . Valerian Root 100 MG CAPS as needed.     No current facility-administered medications for this visit.   No Known Allergies   Review of Systems: All systems reviewed and negative except where noted in HPI.    Labs per HPI  Physical Exam: BP (!) 160/80   Ht 4' 11.5" (1.511 m) Comment: height measured without shoes  Wt 118 lb (53.5 kg)   LMP 11/14/1978 (Approximate)   BMI 23.43 kg/m  Constitutional: Pleasant,well-developed, female in no acute distress. HEENT: Normocephalic and atraumatic. Conjunctivae are normal. No scleral icterus. Neck supple.  Cardiovascular: Normal rate, regular rhythm.  Pulmonary/chest: Effort normal and breath sounds normal.  Abdominal: Soft, nondistended, nontender.   DRE - hard stool in vault, no impaction, no mass lesions, normal squeeze and decent. Mild dermatitis in perianal area. CMA Tia Alert as standby Extremities: no edema Neurological: Alert and oriented to person place and time. Skin: Skin is warm and dry. No rashes noted. Psychiatric: Normal mood and affect. Behavior is normal.   ASSESSMENT AND PLAN: 87 year old female here for new patient assessment of the following:  Chronic constipation Pruritus Ani  History as outlined above, longstanding chronic constipation.  She has had some perianal pruritus as well over the years.  Symptoms more recently involves increased difficulty with evacuation.  DRE shows hard stool in the vault but no obvious mass lesions or stricture.  There is no evidence of pelvic floor dysfunction/dyssynergia on DRE today.  Last colonoscopy was 10 years ago and reportedly looked okay but we will get that report from [facility] to confirm.  Patient and  daughter inquire about a virtual colonoscopy, I discussed this with them.  At this point in her life I would not recommend elective screening with virtual or optical colonoscopy given her age.  The reason to do further work-up at this point time would be related to symptoms.  The patient has  no alarm symptoms, symptoms are chronic, no anemia, do not feel that we need to pursue imaging right now and would prefer trial of conservative management initially and see how she does.  If she fails management of this with initial measures then we can consider further evaluation, which in this setting I think would be flex sig if we do anything.  Patient and daughter were agreeable to this.  She has hard stool in the vault and I think we need to soften the stool initially to allow her to evacuate this better.  We will try MiraLAX once daily, she can titrate this up or down as needed.  If no benefit in a few weeks she should contact me.  In regards to the perianal irritation, could be reactive to wet wipes she is using to clean herself.  Recommend she stop using those, use wet toilet paper to clean herself for now.  She agrees.  She can follow-up with as needed for this issue  [place], MD [facility]  CC: Deland Pretty, MD

## 2021-02-11 NOTE — Patient Instructions (Signed)
If you are age 85 or older, your body mass index should be between 23-30. Your Body mass index is 23.43 kg/m. If this is out of the aforementioned range listed, please consider follow up with your Primary Care Provider.  If you are age 39 or younger, your body mass index should be between 19-25. Your Body mass index is 23.43 kg/m. If this is out of the aformentioned range listed, please consider follow up with your Primary Care Provider.   We will request your colonoscopy records from Dr. Perley Jain office.  Please purchase the following medications over the counter and take as directed: Miralax: Take as directed once a day   Please purchase the following medications over the counter and take as directed:

## 2021-03-02 ENCOUNTER — Telehealth: Payer: Self-pay | Admitting: Gastroenterology

## 2021-03-02 NOTE — Telephone Encounter (Signed)
Results of prior colonoscopy arrived:  Colonoscopy 02/03/2011 - Dr. Watt Climes - hemorrhoids, sigmoid diverticulosis, 3 small polyps removed in the transverse colon with forceps, otherwise normal colon. Path shows one adenoma and lymphoid aggregates otherwise.  Continue plan as outlined in clinic note during time of visit.

## 2021-03-05 NOTE — Progress Notes (Signed)
Referring-Walter Shelia Media, MD Reason for referral-Ventricular tachycardia  HPI: 87 year old female for evaluation of ventricular tachycardia at request of Deland Pretty, MD. Previously followed by Rex Kras DO.  Also with history of splenic artery aneurysm.  Monitor January 2021 showed sinus rhythm with 4 episodes of nonsustained ventricular tachycardia (maximum number of beats 10).  No symptoms reported.  Echocardiogram January 2021 showed ejection fraction 55 to 60%, grade 1 diastolic dysfunction.  [place] nuclear study March 2021 showed ejection fraction 62% and normal perfusion.  Patient started on metoprolol for nonsustained ventricular tachycardia.  Chest pain, palpitations or syncope.  Occasional minimal pedal edema.  Current Outpatient Medications  Medication Sig Dispense Refill  . Cholecalciferol (VITAMIN D) 2000 UNITS CAPS Take 2,000 Units by mouth every other day.     . clobetasol ointment (TEMOVATE) 0.05 % Apply a small amount topically BID for up to 2 weeks prn. Can use the cream 2 x a week baseline as needed. Not for daily long term use. 60 g 1  . Coenzyme Q10 (CO Q 10 PO) Take by mouth every other day.    . Cyanocobalamin (VITAMIN B 12 PO) Take 1 tablet by mouth daily.    Marland Kitchen denosumab (PROLIA) 60 MG/ML SOSY injection 60 mg    . Flaxseed, Linseed, (FLAXSEED OIL) 1000 MG CAPS Take by mouth daily.    . Grape Seed 50 MG TABS as needed.    . magnesium 30 MG tablet Take 30 mg by mouth daily.    . metoprolol tartrate (LOPRESSOR) 25 MG tablet TAKE (1/2) TABLET TWICE DAILY. 60 tablet 0  . Nutritional Supplements (JUICE PLUS FIBRE PO) Take by mouth daily.    . Omega-3 Fatty Acids (FISH OIL PO) Take by mouth daily.     . polyethylene glycol (MIRALAX) 17 g packet Take 17 g by mouth daily. 14 each 0  . Tragacanth (ASTRAGALUS ROOT) POWD 1    . Turmeric 500 MG CAPS Take by mouth.    Marland Kitchen UNABLE TO FIND astragulus    . Valerian Root 100 MG CAPS as needed.     No current facility-administered  medications for this visit.    No Known Allergies   Past Medical History:  Diagnosis Date  . Allergic rhinitis   . Anxiety   . Atrophic vaginitis   . Benign head tremor   . CKD (chronic kidney disease) stage 3, GFR 30-59 ml/min (HCC)   . Dizziness   . Elevated serum glutamic pyruvic transaminase (SGPT) level   . History of anxiety   . History of rheumatic fever   . Hypertension   . Lung nodule   . Macrocytosis    History of chronic macrocytosis  . Melanoma ([place])   . Meralgia paresthetica    History of  . NSVT (nonsustained ventricular tachycardia) ([place])   . Osteoporosis   . Palpitations    occasional  . Post-menopausal   . Scarlet fever   . Sclerosis of the skin    lichen  . Splenic artery aneurysm ([place])   . Syncope   . Tinnitus    Chronic    Past Surgical History:  Procedure Laterality Date  . CATARACT EXTRACTION Bilateral   . DENTAL SURGERY    . SKIN CANCER EXCISION    . TONSILLECTOMY      Social History   Socioeconomic History  . Marital status: Married    Spouse name: Not on file  . Number of children: 5  . Years of education: Not  on file  . Highest education level: Not on file  Occupational History  . Occupation: retired  Tobacco Use  . Smoking status: Former Smoker    Packs/day: 0.25    Years: 5.00    Pack years: 1.25    Types: Cigarettes    Quit date: 1968    Years since quitting: 54.3  . Smokeless tobacco: Never Used  . Tobacco comment: Pt was a some day smoker.  Vaping Use  . Vaping Use: Never used  Substance and Sexual Activity  . Alcohol use: No    Alcohol/week: 0.0 standard drinks  . Drug use: No  . Sexual activity: Not Currently    Partners: Male    Birth control/protection: Abstinence, Post-menopausal  Other Topics Concern  . Not on file  Social History Narrative  . Not on file   Social Determinants of Health   Financial Resource Strain: Not on file  Food Insecurity: Not on file  Transportation Needs: Not on file   Physical Activity: Not on file  Stress: Not on file  Social Connections: Not on file  Intimate Partner Violence: Not on file    Family History  Problem Relation Age of Onset  . Heart attack Father   . Cancer - Colon Mother 71  . Hypertension Mother   . Heart disease Sister   . Cancer Son        muscle    ROS: Back pain but no fevers or chills, productive cough, hemoptysis, dysphasia, odynophagia, melena, hematochezia, dysuria, hematuria, rash, seizure activity, orthopnea, PND, claudication. Remaining systems are negative.  Physical Exam:   Blood pressure 136/86, pulse 75, height 4' 11.5" (1.511 m), weight 119 lb 12.8 oz (54.3 kg), last menstrual period 11/14/1978.  General:  Well developed/well nourished in NAD Skin warm/dry Patient not depressed No peripheral clubbing Back-normal HEENT-normal/normal eyelids Neck supple/normal carotid upstroke bilaterally; no bruits; no JVD; no thyromegaly chest - CTA/ normal expansion CV - RRR/normal S1 and S2; no murmurs, rubs or gallops;  PMI nondisplaced Abdomen -NT/ND, no HSM, no mass, + bowel sounds, no bruit 2+ femoral pulses, no bruits Ext-no edema, chords, 1+ DP Neuro-grossly nonfocal  ECG -normal sinus rhythm at a rate of 75, no ST changes.  Personally reviewed  A/P  1 nonsustained ventricular tachycardia-patient is asymptomatic at present.  LV function is normal.  We will continue beta-blocker.  2 hypertension-blood pressure controlled.  Continue present medical regimen.  Kirk Ruths, MD

## 2021-03-12 ENCOUNTER — Encounter: Payer: Self-pay | Admitting: Cardiology

## 2021-03-12 ENCOUNTER — Ambulatory Visit: Payer: Medicare HMO | Admitting: Cardiology

## 2021-03-12 ENCOUNTER — Other Ambulatory Visit: Payer: Self-pay

## 2021-03-12 VITALS — BP 136/86 | HR 75 | Ht 59.5 in | Wt 119.8 lb

## 2021-03-12 DIAGNOSIS — I472 Ventricular tachycardia: Secondary | ICD-10-CM

## 2021-03-12 DIAGNOSIS — R002 Palpitations: Secondary | ICD-10-CM | POA: Diagnosis not present

## 2021-03-12 DIAGNOSIS — I4729 Other ventricular tachycardia: Secondary | ICD-10-CM

## 2021-03-12 DIAGNOSIS — I10 Essential (primary) hypertension: Secondary | ICD-10-CM

## 2021-03-12 NOTE — Patient Instructions (Signed)

## 2021-04-07 DIAGNOSIS — M810 Age-related osteoporosis without current pathological fracture: Secondary | ICD-10-CM | POA: Diagnosis not present

## 2021-04-09 DIAGNOSIS — U071 COVID-19: Secondary | ICD-10-CM | POA: Diagnosis not present

## 2021-04-09 DIAGNOSIS — R059 Cough, unspecified: Secondary | ICD-10-CM | POA: Diagnosis not present

## 2021-04-13 DIAGNOSIS — N1831 Chronic kidney disease, stage 3a: Secondary | ICD-10-CM | POA: Diagnosis not present

## 2021-04-13 DIAGNOSIS — I129 Hypertensive chronic kidney disease with stage 1 through stage 4 chronic kidney disease, or unspecified chronic kidney disease: Secondary | ICD-10-CM | POA: Diagnosis not present

## 2021-04-13 DIAGNOSIS — M810 Age-related osteoporosis without current pathological fracture: Secondary | ICD-10-CM | POA: Diagnosis not present

## 2021-04-21 DIAGNOSIS — L570 Actinic keratosis: Secondary | ICD-10-CM | POA: Diagnosis not present

## 2021-04-21 DIAGNOSIS — D485 Neoplasm of uncertain behavior of skin: Secondary | ICD-10-CM | POA: Diagnosis not present

## 2021-04-21 DIAGNOSIS — C44629 Squamous cell carcinoma of skin of left upper limb, including shoulder: Secondary | ICD-10-CM | POA: Diagnosis not present

## 2021-04-23 DIAGNOSIS — L84 Corns and callosities: Secondary | ICD-10-CM | POA: Diagnosis not present

## 2021-04-23 DIAGNOSIS — L603 Nail dystrophy: Secondary | ICD-10-CM | POA: Diagnosis not present

## 2021-04-23 DIAGNOSIS — I739 Peripheral vascular disease, unspecified: Secondary | ICD-10-CM | POA: Diagnosis not present

## 2021-05-13 DIAGNOSIS — I129 Hypertensive chronic kidney disease with stage 1 through stage 4 chronic kidney disease, or unspecified chronic kidney disease: Secondary | ICD-10-CM | POA: Diagnosis not present

## 2021-05-13 DIAGNOSIS — M810 Age-related osteoporosis without current pathological fracture: Secondary | ICD-10-CM | POA: Diagnosis not present

## 2021-05-13 DIAGNOSIS — N1831 Chronic kidney disease, stage 3a: Secondary | ICD-10-CM | POA: Diagnosis not present

## 2021-06-24 DIAGNOSIS — L820 Inflamed seborrheic keratosis: Secondary | ICD-10-CM | POA: Diagnosis not present

## 2021-06-24 DIAGNOSIS — C44629 Squamous cell carcinoma of skin of left upper limb, including shoulder: Secondary | ICD-10-CM | POA: Diagnosis not present

## 2021-07-14 DIAGNOSIS — I129 Hypertensive chronic kidney disease with stage 1 through stage 4 chronic kidney disease, or unspecified chronic kidney disease: Secondary | ICD-10-CM | POA: Diagnosis not present

## 2021-07-14 DIAGNOSIS — N1831 Chronic kidney disease, stage 3a: Secondary | ICD-10-CM | POA: Diagnosis not present

## 2021-07-14 DIAGNOSIS — M810 Age-related osteoporosis without current pathological fracture: Secondary | ICD-10-CM | POA: Diagnosis not present

## 2021-08-03 NOTE — Progress Notes (Signed)
87 y.o. [id number] Married White or Caucasian Not Hispanic or Latino female here for a breast and pelvic .  No vaginal bleeding.   H/O Lichen sclerosis only occasional itching. She has more perianal itching.  She has a BM daily, occasionally constipation.   She has some urge incontinence, just a small amount daily. Worse at night. She has been to physical therapy.   Husband has early dementia.   Patient's last menstrual period was 11/14/1978 (approximate).          Sexually active: No.  The current method of family planning is post menopausal status.    Exercising: Yes.     Gardening  Smoker:  no  Health Maintenance: Pap:   04-06-15 Neg, 03-23-11 Neg  History of abnormal Pap:  no MMG:  09/2008 3D/Density B/Neg/BiRads1, discussed screening BMD:   4/19  Colonoscopy: 01/2011 polyps;  PCP states no longer needed  TDaP:  2015 Gardasil: na   reports that she quit smoking about 54 years ago. Her smoking use included cigarettes. She has a 1.25 pack-year smoking history. She has never used smokeless tobacco. She reports that she does not drink alcohol and does not use drugs. Had 5 children, son died at 71. Has 4 living children, 8 grand children and 6 great grand children  Past Medical History:  Diagnosis Date   Allergic rhinitis    Anxiety    Atrophic vaginitis    Benign head tremor    CKD (chronic kidney disease) stage 3, GFR 30-59 ml/min (HCC)    Dizziness    Elevated serum glutamic pyruvic transaminase (SGPT) level    History of anxiety    History of rheumatic fever    Hypertension    Lung nodule    Macrocytosis    History of chronic macrocytosis   Melanoma (HCC)    Meralgia paresthetica    History of   NSVT (nonsustained ventricular tachycardia) (HCC)    Osteoporosis    Palpitations    occasional   Post-menopausal    Scarlet fever    Sclerosis of the skin    lichen   Splenic artery aneurysm (HCC)    Syncope    Tinnitus    Chronic    Past Surgical History:  Procedure  Laterality Date   CATARACT EXTRACTION Bilateral    DENTAL SURGERY     SKIN CANCER EXCISION     TONSILLECTOMY      Current Outpatient Medications  Medication Sig Dispense Refill   Cholecalciferol (VITAMIN D) 2000 UNITS CAPS Take 2,000 Units by mouth every other day.      clobetasol ointment (TEMOVATE) 0.05 % Apply a small amount topically BID for up to 2 weeks prn. Can use the cream 2 x a week baseline as needed. Not for daily long term use. 60 g 1   Coenzyme Q10 (CO Q 10 PO) Take by mouth every other day.     Cyanocobalamin (VITAMIN B 12 PO) Take 1 tablet by mouth daily.     denosumab (PROLIA) 60 MG/ML SOSY injection 60 mg     Flaxseed, Linseed, (FLAXSEED OIL) 1000 MG CAPS Take by mouth daily.     Grape Seed 50 MG TABS as needed.     magnesium 30 MG tablet Take 30 mg by mouth daily.     metoprolol tartrate (LOPRESSOR) 25 MG tablet TAKE (1/2) TABLET TWICE DAILY. 60 tablet 0   Nutritional Supplements (JUICE PLUS FIBRE PO) Take by mouth daily.     Omega-3 Fatty Acids (  FISH OIL PO) Take by mouth daily.      polyethylene glycol (MIRALAX) 17 g packet Take 17 g by mouth daily. 14 each 0   Tragacanth (ASTRAGALUS ROOT) POWD 1     Turmeric 500 MG CAPS Take by mouth.     UNABLE TO FIND astragulus     Valerian Root 100 MG CAPS as needed.     No current facility-administered medications for this visit.    Family History  Problem Relation Age of Onset   Heart attack Father    Cancer - Colon Mother 14   Hypertension Mother    Heart disease Sister    Cancer Son        muscle    Review of Systems  All other systems reviewed and are negative.  Exam:   LMP 11/14/1978 (Approximate)   Weight change: @WEIGHTCHANGE @ Height:      Ht Readings from Last 3 Encounters:  03/12/21 4' 11.5" (1.511 m)  02/11/21 4' 11.5" (1.511 m)  07/30/20 5' 5.25" (1.657 m)    General appearance: alert, cooperative and appears stated age Head: Normocephalic, without obvious abnormality, atraumatic Neck: no  adenopathy, supple, symmetrical, trachea midline and thyroid normal to inspection and palpation Breasts: normal appearance, no masses or tenderness Abdomen: soft, non-tender; non distended,  no masses,  no organomegaly Extremities: extremities normal, atraumatic, no cyanosis or edema Skin: Skin color, texture, turgor normal. No rashes or lesions Lymph nodes: Cervical, supraclavicular, and axillary nodes normal. No abnormal inguinal nodes palpated Neurologic: Grossly normal   Pelvic: External genitalia: mild whitening and agglutination of labia minora to majora.               Urethra:  normal appearing urethra with no masses, tenderness or lesions              Bartholins and Skenes: normal                 Vagina: atrophic appearing vagina with normal color and discharge, no lesions              Cervix: no lesions               Bimanual Exam:  Uterus:  normal size, contour, position, consistency, mobility, non-tender              Adnexa: no mass, fullness, tenderness               Rectovaginal: Confirms               Anus:  normal sphincter tone, no lesions Perianal skin: erythema, some whitening, irritated  Gae Dry chaperoned for the exam.  1. Encounter for gynecological examination without abnormal finding   2. Lichen sclerosus stable - clobetasol ointment (TEMOVATE) 0.05 %; Apply a small amount topically BID for up to 2 weeks prn. Can use the ointment 2 x a week baseline as needed. Not for daily long term use.  Dispense: 60 g; Refill: 1 -Discussed vulvar skin care  3. Perianal dermatitis Discussed vulvar and perianal skin care. Information given Stop using wipes Use Vaseline - clobetasol ointment (TEMOVATE) 0.05 %; Apply a small amount topically BID for up to 2 weeks prn. Can use the ointment 2 x a week baseline as needed. Not for daily long term use.  Dispense: 60 g; Refill: 1

## 2021-08-05 ENCOUNTER — Ambulatory Visit (INDEPENDENT_AMBULATORY_CARE_PROVIDER_SITE_OTHER): Payer: Medicare HMO | Admitting: Obstetrics and Gynecology

## 2021-08-05 ENCOUNTER — Other Ambulatory Visit: Payer: Self-pay

## 2021-08-05 ENCOUNTER — Encounter: Payer: Self-pay | Admitting: Obstetrics and Gynecology

## 2021-08-05 VITALS — BP 120/72 | HR 80 | Wt 119.0 lb

## 2021-08-05 DIAGNOSIS — Z01419 Encounter for gynecological examination (general) (routine) without abnormal findings: Secondary | ICD-10-CM

## 2021-08-05 DIAGNOSIS — K5909 Other constipation: Secondary | ICD-10-CM | POA: Insufficient documentation

## 2021-08-05 DIAGNOSIS — L309 Dermatitis, unspecified: Secondary | ICD-10-CM | POA: Diagnosis not present

## 2021-08-05 DIAGNOSIS — R252 Cramp and spasm: Secondary | ICD-10-CM | POA: Insufficient documentation

## 2021-08-05 DIAGNOSIS — I499 Cardiac arrhythmia, unspecified: Secondary | ICD-10-CM | POA: Insufficient documentation

## 2021-08-05 DIAGNOSIS — R269 Unspecified abnormalities of gait and mobility: Secondary | ICD-10-CM | POA: Insufficient documentation

## 2021-08-05 DIAGNOSIS — L900 Lichen sclerosus et atrophicus: Secondary | ICD-10-CM | POA: Diagnosis not present

## 2021-08-05 DIAGNOSIS — G8929 Other chronic pain: Secondary | ICD-10-CM | POA: Insufficient documentation

## 2021-08-05 DIAGNOSIS — F419 Anxiety disorder, unspecified: Secondary | ICD-10-CM | POA: Insufficient documentation

## 2021-08-05 DIAGNOSIS — G250 Essential tremor: Secondary | ICD-10-CM | POA: Insufficient documentation

## 2021-08-05 DIAGNOSIS — J309 Allergic rhinitis, unspecified: Secondary | ICD-10-CM | POA: Insufficient documentation

## 2021-08-05 DIAGNOSIS — Z8249 Family history of ischemic heart disease and other diseases of the circulatory system: Secondary | ICD-10-CM | POA: Insufficient documentation

## 2021-08-05 DIAGNOSIS — N1831 Chronic kidney disease, stage 3a: Secondary | ICD-10-CM | POA: Insufficient documentation

## 2021-08-05 DIAGNOSIS — I10 Essential (primary) hypertension: Secondary | ICD-10-CM | POA: Insufficient documentation

## 2021-08-05 MED ORDER — CLOBETASOL PROPIONATE 0.05 % EX OINT: TOPICAL_OINTMENT | CUTANEOUS | 1 refills | Status: DC

## 2021-08-05 NOTE — Patient Instructions (Signed)

## 2021-08-20 DIAGNOSIS — I10 Essential (primary) hypertension: Secondary | ICD-10-CM | POA: Diagnosis not present

## 2021-09-20 DIAGNOSIS — M5459 Other low back pain: Secondary | ICD-10-CM | POA: Diagnosis not present

## 2021-09-20 DIAGNOSIS — M419 Scoliosis, unspecified: Secondary | ICD-10-CM | POA: Diagnosis not present

## 2021-09-20 DIAGNOSIS — R42 Dizziness and giddiness: Secondary | ICD-10-CM | POA: Diagnosis not present

## 2021-09-20 DIAGNOSIS — M810 Age-related osteoporosis without current pathological fracture: Secondary | ICD-10-CM | POA: Diagnosis not present

## 2021-09-20 DIAGNOSIS — R2689 Other abnormalities of gait and mobility: Secondary | ICD-10-CM | POA: Diagnosis not present

## 2021-09-25 DIAGNOSIS — M810 Age-related osteoporosis without current pathological fracture: Secondary | ICD-10-CM | POA: Diagnosis not present

## 2021-09-25 DIAGNOSIS — I4892 Unspecified atrial flutter: Secondary | ICD-10-CM | POA: Diagnosis not present

## 2021-09-25 DIAGNOSIS — M419 Scoliosis, unspecified: Secondary | ICD-10-CM | POA: Diagnosis not present

## 2021-09-25 DIAGNOSIS — R69 Illness, unspecified: Secondary | ICD-10-CM | POA: Diagnosis not present

## 2021-09-25 DIAGNOSIS — H8110 Benign paroxysmal vertigo, unspecified ear: Secondary | ICD-10-CM | POA: Diagnosis not present

## 2021-09-25 DIAGNOSIS — Z9181 History of falling: Secondary | ICD-10-CM | POA: Diagnosis not present

## 2021-09-25 DIAGNOSIS — N1831 Chronic kidney disease, stage 3a: Secondary | ICD-10-CM | POA: Diagnosis not present

## 2021-09-28 DIAGNOSIS — M810 Age-related osteoporosis without current pathological fracture: Secondary | ICD-10-CM | POA: Diagnosis not present

## 2021-09-28 DIAGNOSIS — I10 Essential (primary) hypertension: Secondary | ICD-10-CM | POA: Diagnosis not present

## 2021-09-28 DIAGNOSIS — Z9181 History of falling: Secondary | ICD-10-CM | POA: Diagnosis not present

## 2021-10-14 DIAGNOSIS — M810 Age-related osteoporosis without current pathological fracture: Secondary | ICD-10-CM | POA: Diagnosis not present

## 2021-10-18 DIAGNOSIS — N1831 Chronic kidney disease, stage 3a: Secondary | ICD-10-CM | POA: Diagnosis not present

## 2021-10-18 DIAGNOSIS — R69 Illness, unspecified: Secondary | ICD-10-CM | POA: Diagnosis not present

## 2021-10-18 DIAGNOSIS — M419 Scoliosis, unspecified: Secondary | ICD-10-CM | POA: Diagnosis not present

## 2021-10-18 DIAGNOSIS — I4892 Unspecified atrial flutter: Secondary | ICD-10-CM | POA: Diagnosis not present

## 2021-10-18 DIAGNOSIS — H8110 Benign paroxysmal vertigo, unspecified ear: Secondary | ICD-10-CM | POA: Diagnosis not present

## 2021-10-18 DIAGNOSIS — M810 Age-related osteoporosis without current pathological fracture: Secondary | ICD-10-CM | POA: Diagnosis not present

## 2021-10-18 DIAGNOSIS — Z9181 History of falling: Secondary | ICD-10-CM | POA: Diagnosis not present

## 2021-10-20 DIAGNOSIS — I10 Essential (primary) hypertension: Secondary | ICD-10-CM | POA: Diagnosis not present

## 2021-10-20 DIAGNOSIS — Z9181 History of falling: Secondary | ICD-10-CM | POA: Diagnosis not present

## 2021-10-20 DIAGNOSIS — M810 Age-related osteoporosis without current pathological fracture: Secondary | ICD-10-CM | POA: Diagnosis not present

## 2021-11-01 DIAGNOSIS — R69 Illness, unspecified: Secondary | ICD-10-CM | POA: Diagnosis not present

## 2021-11-01 DIAGNOSIS — H8110 Benign paroxysmal vertigo, unspecified ear: Secondary | ICD-10-CM | POA: Diagnosis not present

## 2021-11-01 DIAGNOSIS — M419 Scoliosis, unspecified: Secondary | ICD-10-CM | POA: Diagnosis not present

## 2021-11-01 DIAGNOSIS — M810 Age-related osteoporosis without current pathological fracture: Secondary | ICD-10-CM | POA: Diagnosis not present

## 2021-11-12 DIAGNOSIS — I129 Hypertensive chronic kidney disease with stage 1 through stage 4 chronic kidney disease, or unspecified chronic kidney disease: Secondary | ICD-10-CM | POA: Diagnosis not present

## 2021-11-12 DIAGNOSIS — M810 Age-related osteoporosis without current pathological fracture: Secondary | ICD-10-CM | POA: Diagnosis not present

## 2021-11-12 DIAGNOSIS — N1831 Chronic kidney disease, stage 3a: Secondary | ICD-10-CM | POA: Diagnosis not present

## 2021-11-18 IMAGING — DX DG SHOULDER 2+V*R*
3 series · 3 of 3 positions shown · non-contrast
Comparison: None.

CLINICAL DATA: Chronic bilateral shoulder pain.

EXAM:
RIGHT SHOULDER - 2+ VIEW

[shoulder (grashey view) ap]
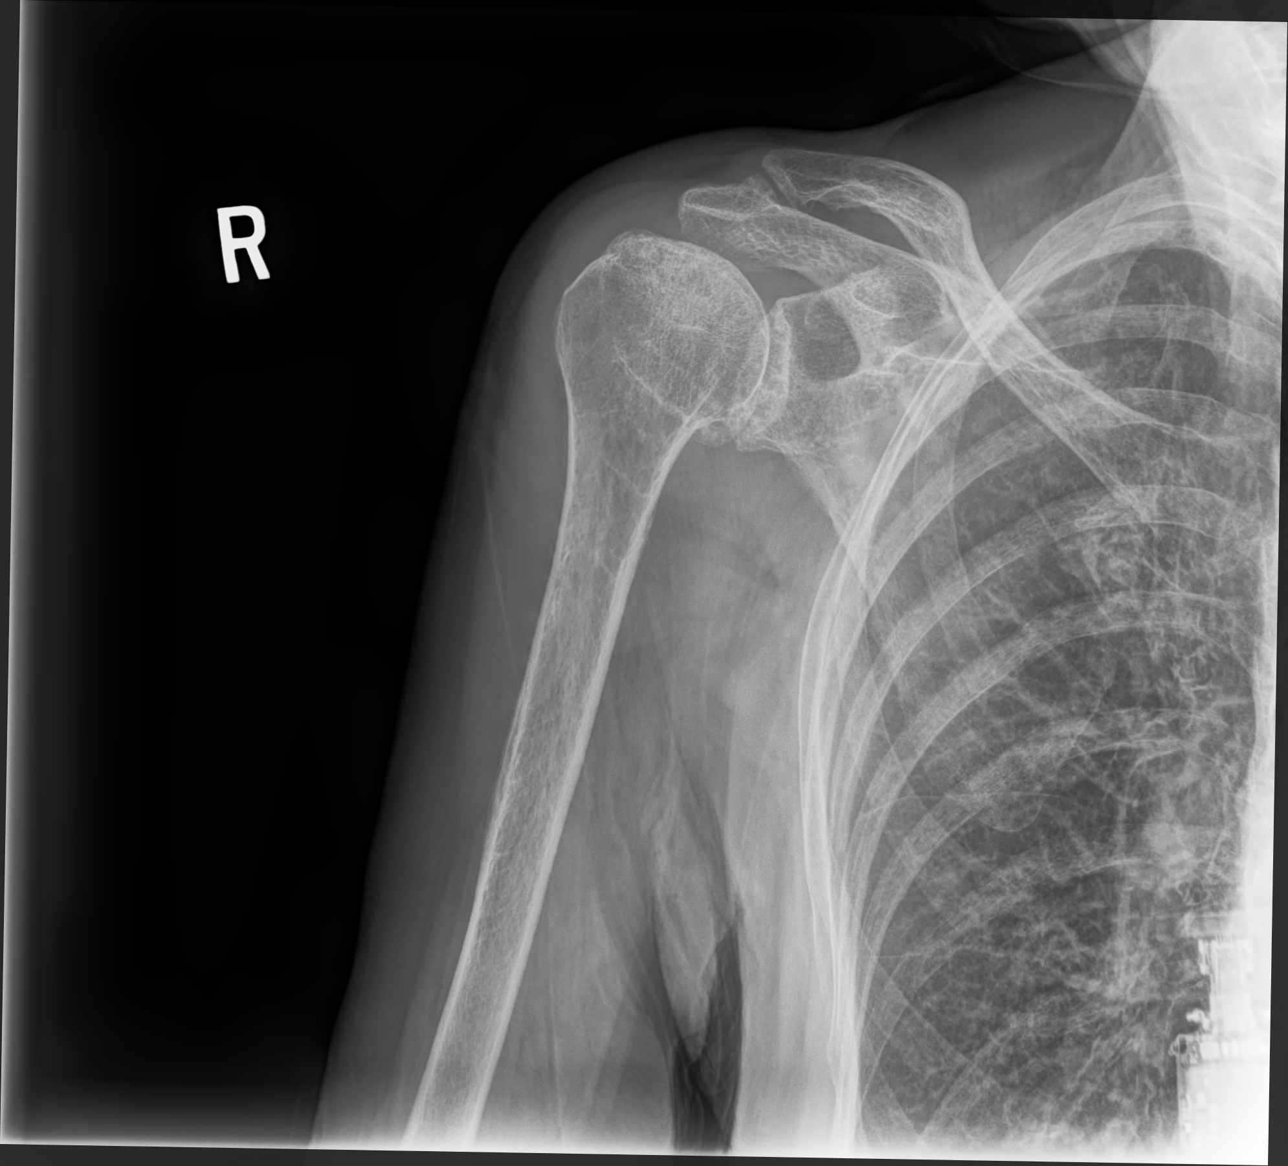

[shoulder (y view) lat]
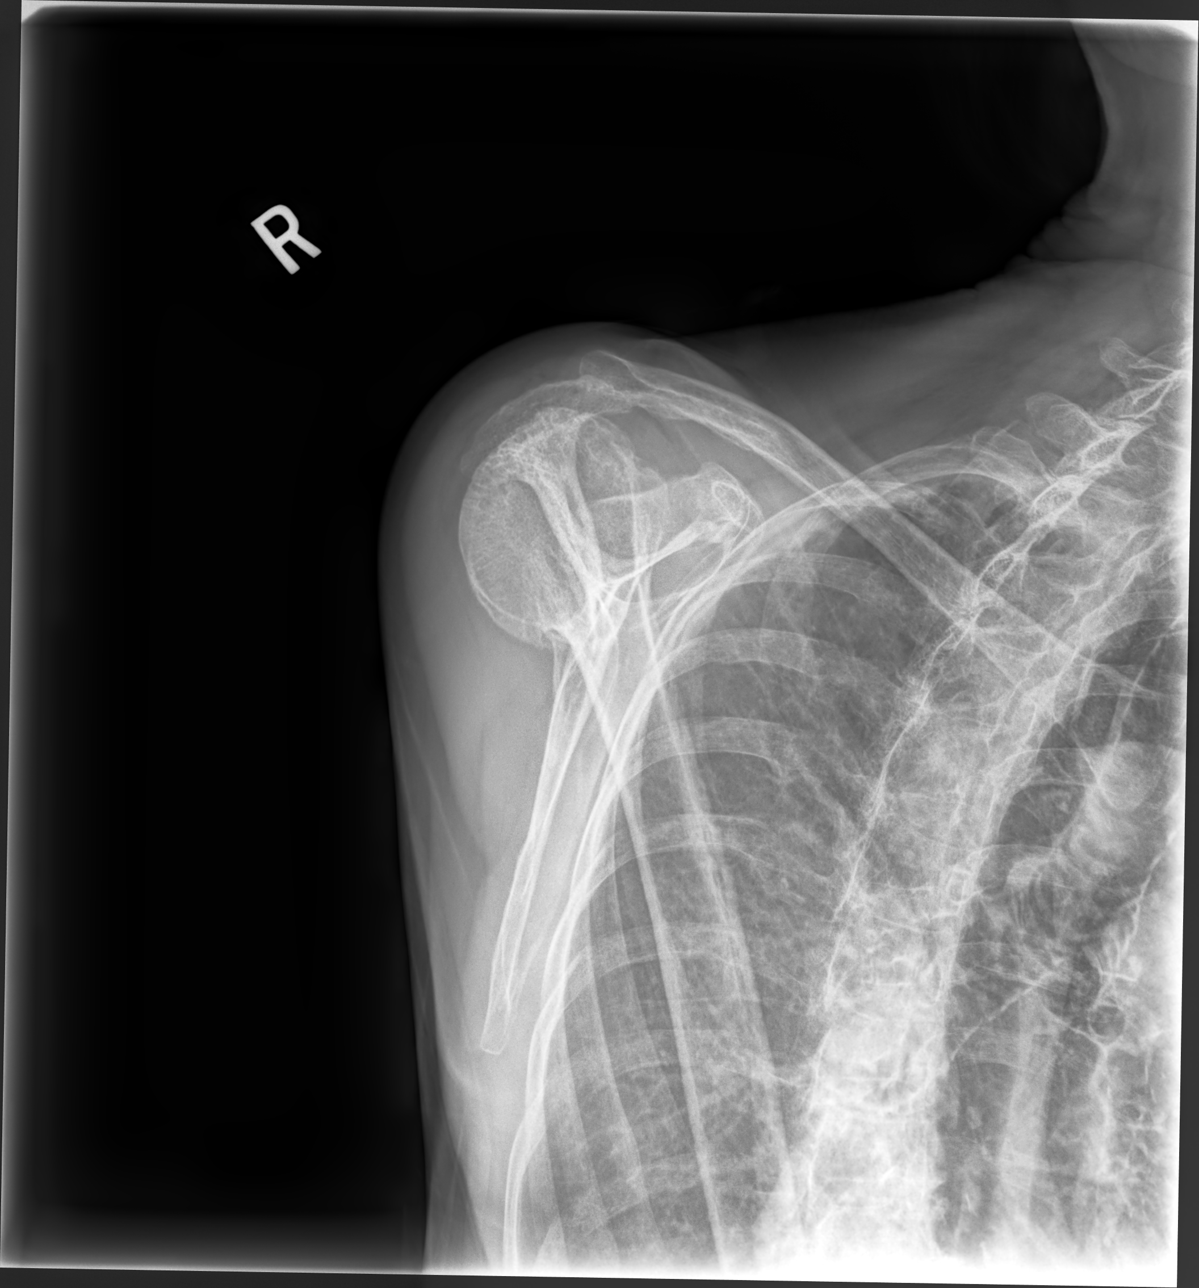

[shoulder axial 45°]
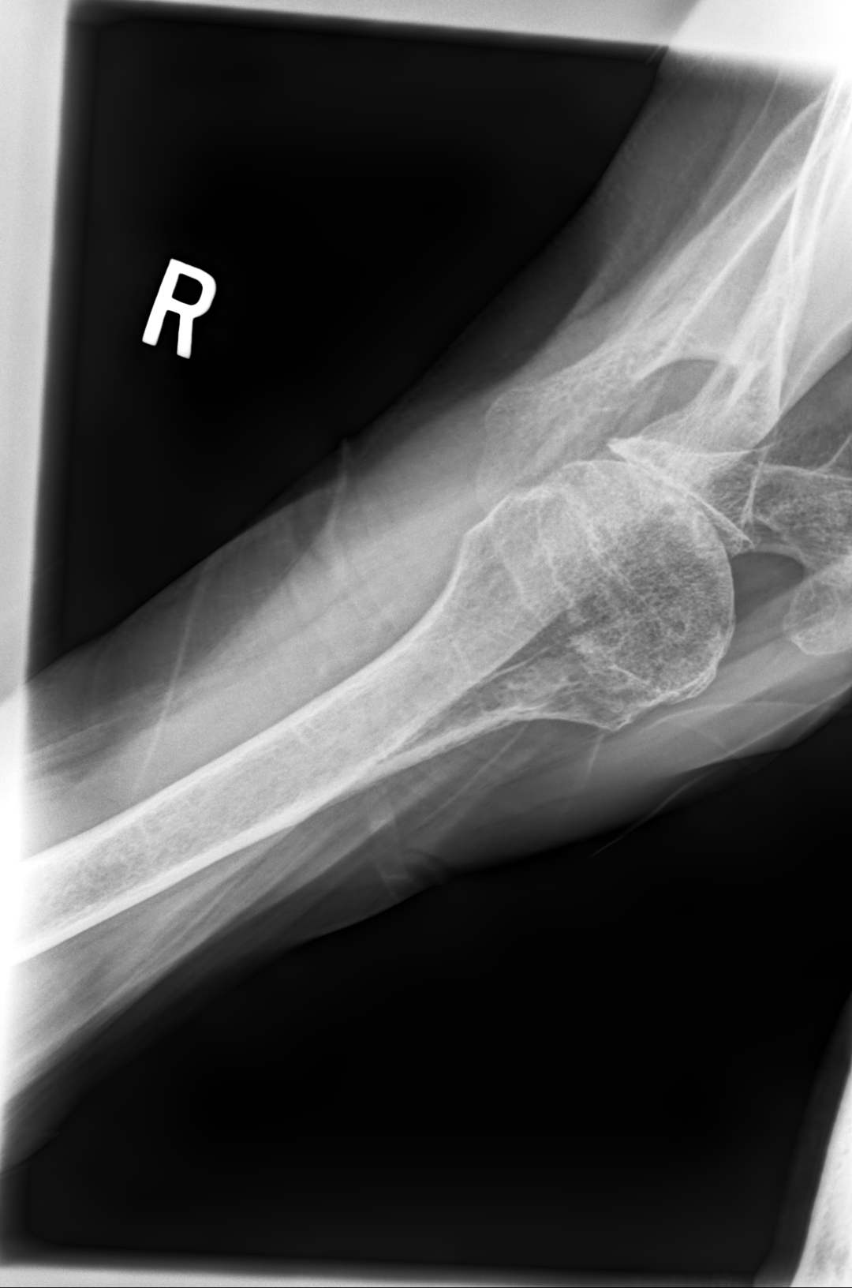

[3 of 3 positions shown; findings below may reference images not displayed]

FINDINGS: Narrowing, sclerosis and osteophytosis involving the glenohumeral
joint. Subchondral sclerosis and cyst formation are seen along the
humeral head, difficult to exclude remote trauma. Mild to moderate
degenerative changes in the acromioclavicular joint. Visualized
portion of the right chest is unremarkable.
IMPRESSION: 1. Osteoarthritis in the glenohumeral and acromioclavicular joints.
2. Degenerative changes along the humeral head. Difficult to exclude
remote trauma.

## 2021-11-18 IMAGING — DX DG CERVICAL SPINE COMPLETE 4+V
5 series · 6 of 6 positions shown · non-contrast
Comparison: None.

CLINICAL DATA: Chronic neck and bilateral shoulder pain. History of
falls.

EXAM:
CERVICAL SPINE - COMPLETE 4+ VIEW

[cervical spine lat]
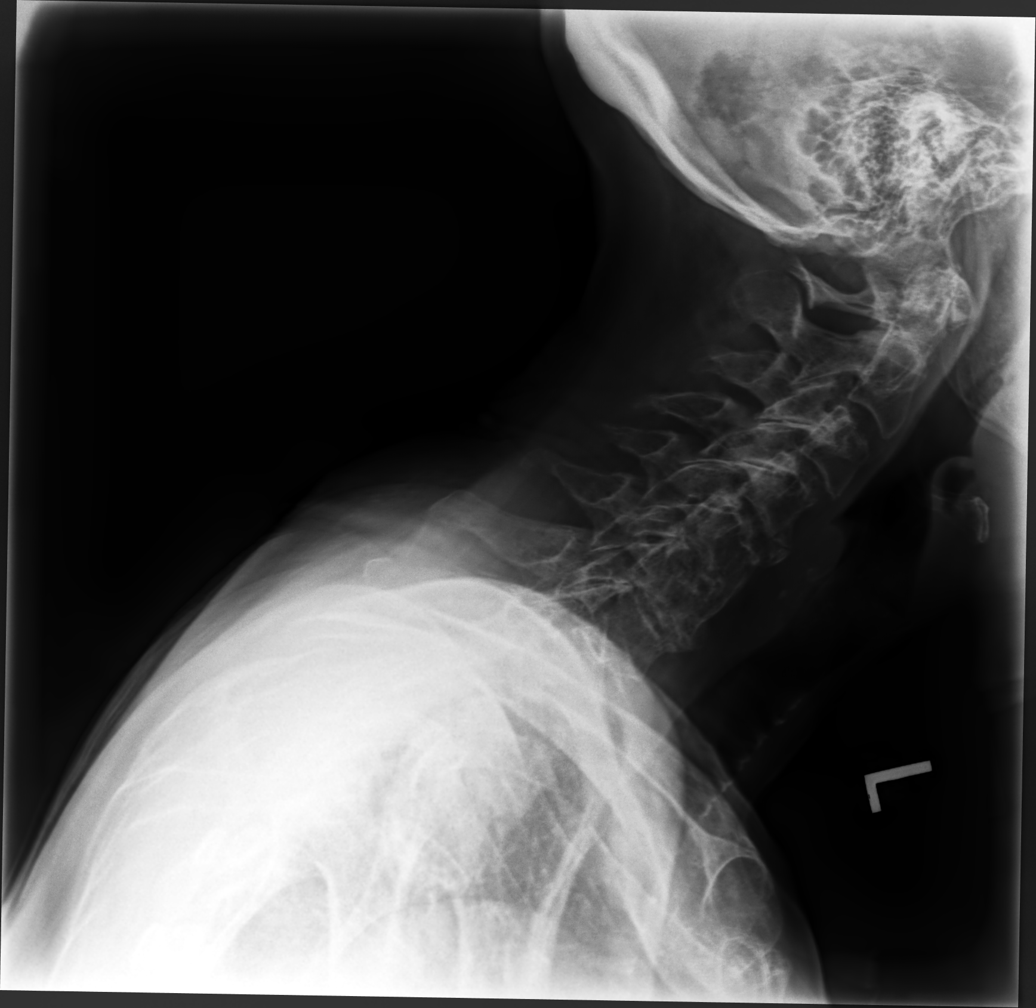

[cervical spine mlo (1 of 2)]
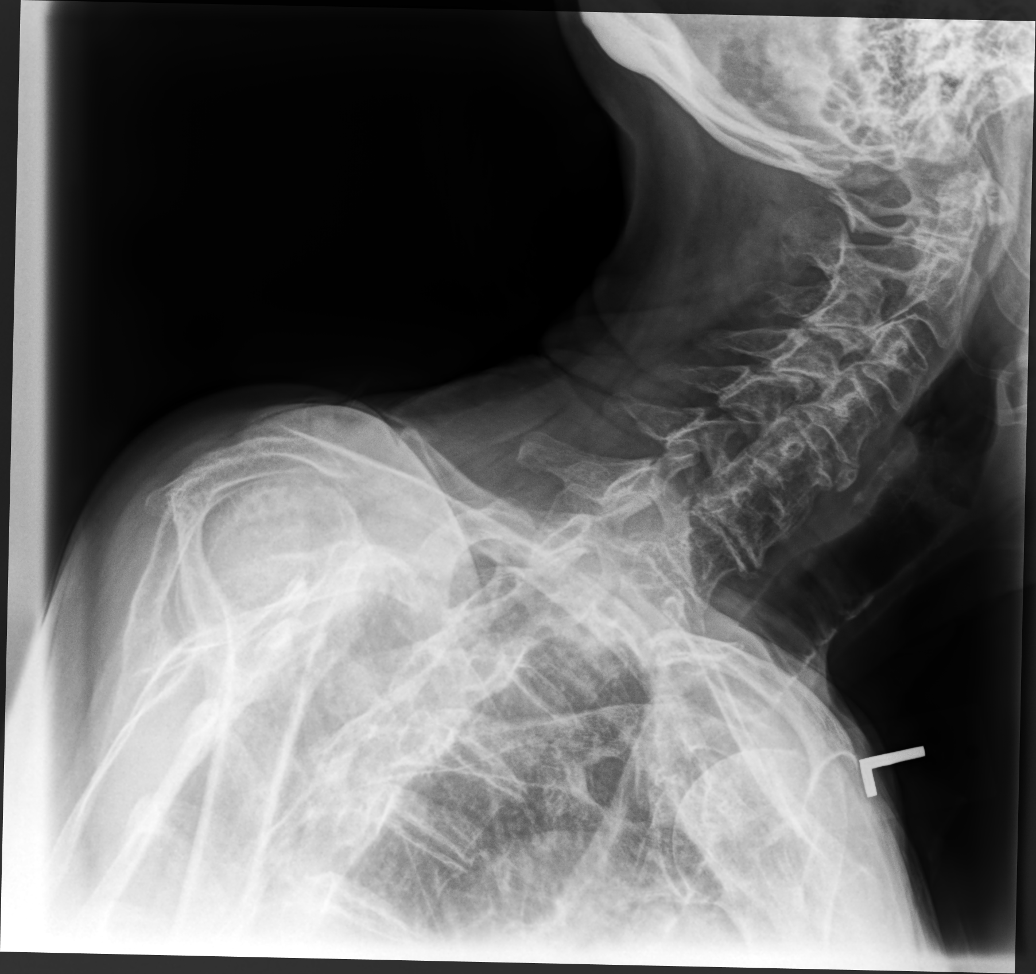

[cervical spine mlo (2 of 2)]
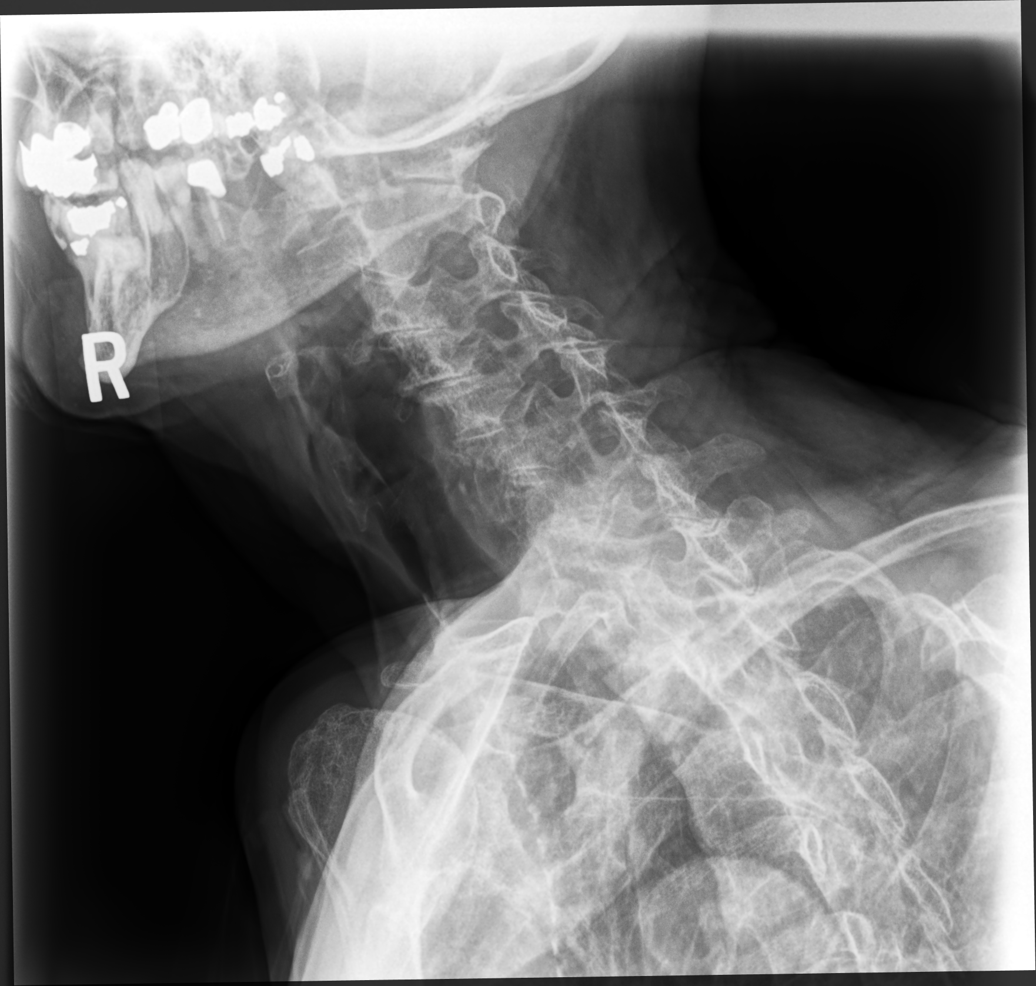

[cervical spine ap]
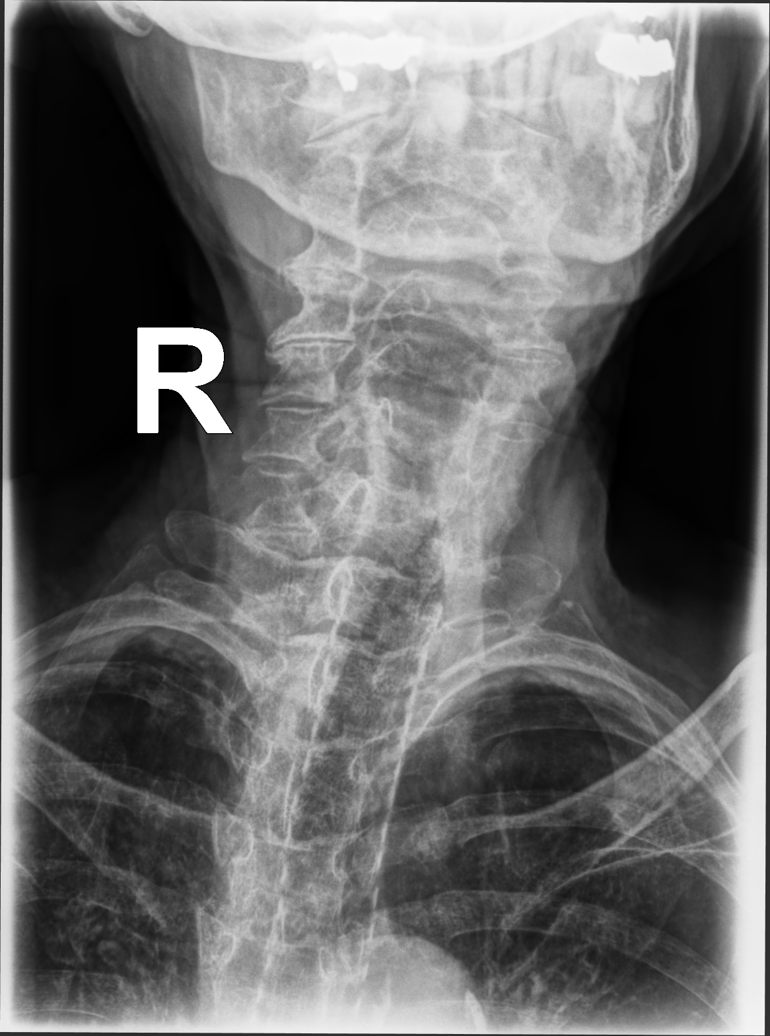

[Series 5: cervical spine open mouth ap · 2 of 2 slices shown]
[im 1/2]
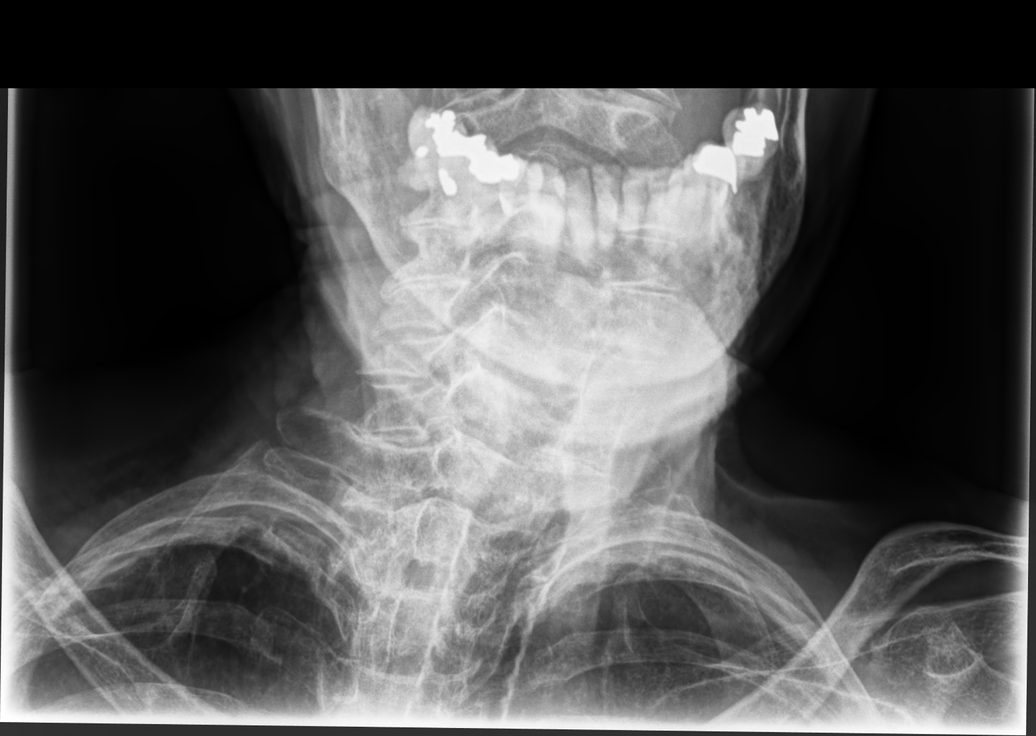
[im 2/2]
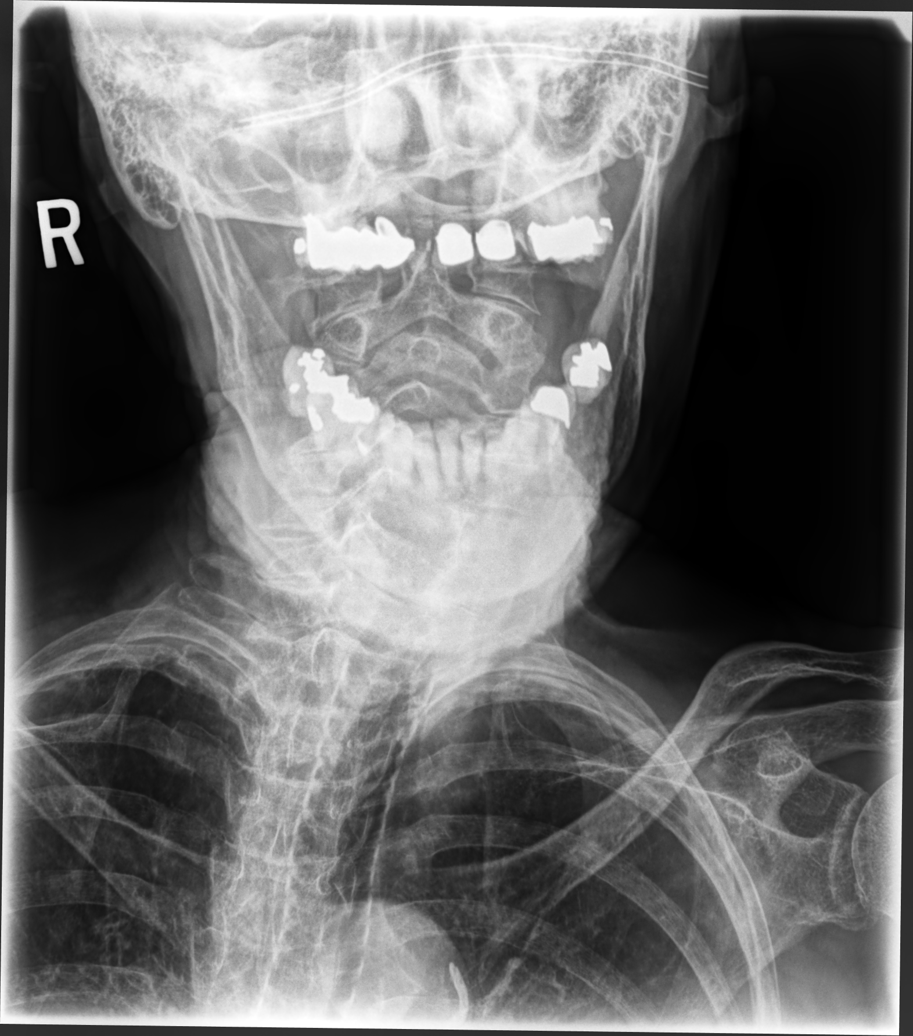

[6 of 6 positions shown; findings below may reference images not displayed]

FINDINGS: The bones are demineralized. The prevertebral soft tissues appear
normal. There is straightening of the usual cervical lordosis with a
slight degenerative anterolisthesis at C3-4 and C4-5. There is a
convex left scoliosis. There is multilevel spondylosis with disc
space narrowing, uncinate spurring and facet hypertrophy, most
advanced from C4-5 through C6-7. The oblique views demonstrate mild
osseous foraminal narrowing which appears worst on the right at C3-4
and C4-5. No evidence of acute fracture.
IMPRESSION: 1. Multilevel spondylosis with resulting osseous foraminal narrowing
as described. No acute osseous findings or significant malalignment
identified.
2. Mild convex left scoliosis.

## 2022-01-04 DIAGNOSIS — L821 Other seborrheic keratosis: Secondary | ICD-10-CM | POA: Diagnosis not present

## 2022-01-04 DIAGNOSIS — L219 Seborrheic dermatitis, unspecified: Secondary | ICD-10-CM | POA: Diagnosis not present

## 2022-01-04 DIAGNOSIS — L814 Other melanin hyperpigmentation: Secondary | ICD-10-CM | POA: Diagnosis not present

## 2022-01-04 DIAGNOSIS — D225 Melanocytic nevi of trunk: Secondary | ICD-10-CM | POA: Diagnosis not present

## 2022-01-04 DIAGNOSIS — L578 Other skin changes due to chronic exposure to nonionizing radiation: Secondary | ICD-10-CM | POA: Diagnosis not present

## 2022-01-04 DIAGNOSIS — L570 Actinic keratosis: Secondary | ICD-10-CM | POA: Diagnosis not present

## 2022-02-11 DIAGNOSIS — M810 Age-related osteoporosis without current pathological fracture: Secondary | ICD-10-CM | POA: Diagnosis not present

## 2022-02-11 DIAGNOSIS — N1831 Chronic kidney disease, stage 3a: Secondary | ICD-10-CM | POA: Diagnosis not present

## 2022-02-11 DIAGNOSIS — I129 Hypertensive chronic kidney disease with stage 1 through stage 4 chronic kidney disease, or unspecified chronic kidney disease: Secondary | ICD-10-CM | POA: Diagnosis not present

## 2022-03-13 DIAGNOSIS — I129 Hypertensive chronic kidney disease with stage 1 through stage 4 chronic kidney disease, or unspecified chronic kidney disease: Secondary | ICD-10-CM | POA: Diagnosis not present

## 2022-03-13 DIAGNOSIS — N1831 Chronic kidney disease, stage 3a: Secondary | ICD-10-CM | POA: Diagnosis not present

## 2022-03-13 DIAGNOSIS — M810 Age-related osteoporosis without current pathological fracture: Secondary | ICD-10-CM | POA: Diagnosis not present

## 2022-03-23 DIAGNOSIS — M810 Age-related osteoporosis without current pathological fracture: Secondary | ICD-10-CM | POA: Diagnosis not present

## 2022-03-23 DIAGNOSIS — Z7982 Long term (current) use of aspirin: Secondary | ICD-10-CM | POA: Diagnosis not present

## 2022-03-23 DIAGNOSIS — D72819 Decreased white blood cell count, unspecified: Secondary | ICD-10-CM | POA: Diagnosis not present

## 2022-03-23 DIAGNOSIS — I10 Essential (primary) hypertension: Secondary | ICD-10-CM | POA: Diagnosis not present

## 2022-03-29 DIAGNOSIS — L821 Other seborrheic keratosis: Secondary | ICD-10-CM | POA: Diagnosis not present

## 2022-03-29 DIAGNOSIS — L570 Actinic keratosis: Secondary | ICD-10-CM | POA: Diagnosis not present

## 2022-03-29 DIAGNOSIS — L820 Inflamed seborrheic keratosis: Secondary | ICD-10-CM | POA: Diagnosis not present

## 2022-03-29 DIAGNOSIS — C44629 Squamous cell carcinoma of skin of left upper limb, including shoulder: Secondary | ICD-10-CM | POA: Diagnosis not present

## 2022-03-30 DIAGNOSIS — I728 Aneurysm of other specified arteries: Secondary | ICD-10-CM | POA: Diagnosis not present

## 2022-03-30 DIAGNOSIS — N1831 Chronic kidney disease, stage 3a: Secondary | ICD-10-CM | POA: Diagnosis not present

## 2022-03-30 DIAGNOSIS — I4729 Other ventricular tachycardia: Secondary | ICD-10-CM | POA: Diagnosis not present

## 2022-03-30 DIAGNOSIS — M810 Age-related osteoporosis without current pathological fracture: Secondary | ICD-10-CM | POA: Diagnosis not present

## 2022-03-30 DIAGNOSIS — I10 Essential (primary) hypertension: Secondary | ICD-10-CM | POA: Diagnosis not present

## 2022-03-30 DIAGNOSIS — Z800 Family history of malignant neoplasm of digestive organs: Secondary | ICD-10-CM | POA: Diagnosis not present

## 2022-03-30 DIAGNOSIS — R109 Unspecified abdominal pain: Secondary | ICD-10-CM | POA: Diagnosis not present

## 2022-03-30 DIAGNOSIS — K5909 Other constipation: Secondary | ICD-10-CM | POA: Diagnosis not present

## 2022-03-30 DIAGNOSIS — Z0000 Encounter for general adult medical examination without abnormal findings: Secondary | ICD-10-CM | POA: Diagnosis not present

## 2022-03-30 DIAGNOSIS — J309 Allergic rhinitis, unspecified: Secondary | ICD-10-CM | POA: Diagnosis not present

## 2022-03-30 NOTE — Progress Notes (Signed)
HPI:FU VT. Previously followed by Sunit Tolia DO.  Also with history of splenic artery aneurysm.  Monitor January 2021 showed sinus rhythm with 4 episodes of nonsustained ventricular tachycardia (maximum number of beats 10).  No symptoms reported.  Echocardiogram January 2021 showed ejection fraction 55 to 61%, grade 1 diastolic dysfunction.  [facility] nuclear study March 2021 showed ejection fraction 62% and normal perfusion.  Patient started on metoprolol for nonsustained ventricular tachycardia.  Since last seen, she denies dyspnea, chest pain, palpitations or syncope.  Current Outpatient Medications  Medication Sig Dispense Refill   Cholecalciferol (VITAMIN D) 2000 UNITS CAPS Take 2,000 Units by mouth every other day.      clobetasol ointment (TEMOVATE) 0.05 % Apply a small amount topically BID for up to 2 weeks prn. Can use the ointment 2 x a week baseline as needed. Not for daily long term use. 60 g 1   Coenzyme Q10 (CO Q 10 PO) Take by mouth every other day.     Cyanocobalamin (VITAMIN B 12 PO) Take 1 tablet by mouth daily.     denosumab (PROLIA) 60 MG/ML SOSY injection 60 mg     Flaxseed, Linseed, (FLAXSEED OIL) 1000 MG CAPS Take by mouth daily.     Grape Seed 50 MG TABS as needed.     magnesium 30 MG tablet Take 30 mg by mouth daily.     metoprolol tartrate (LOPRESSOR) 25 MG tablet TAKE (1/2) TABLET TWICE DAILY. 60 tablet 0   Omega-3 Fatty Acids (FISH OIL PO) Take by mouth daily.      polyethylene glycol (MIRALAX) 17 g packet Take 17 g by mouth daily. 14 each 0   Tragacanth (ASTRAGALUS ROOT) POWD 1     Turmeric 500 MG CAPS Take by mouth.     Valerian Root 100 MG CAPS as needed.     UNABLE TO FIND astragulus (Patient not taking: Reported on 04/07/2022)     No current facility-administered medications for this visit.     Past Medical History:  Diagnosis Date   Allergic rhinitis    Anxiety    Atrophic vaginitis    Benign head tremor    CKD (chronic kidney disease) stage 3,  GFR 30-59 ml/min (HCC)    Dizziness    Elevated serum glutamic pyruvic transaminase (SGPT) level    History of anxiety    History of rheumatic fever    Hypertension    Lung nodule    Macrocytosis    History of chronic macrocytosis   Melanoma (HCC)    Meralgia paresthetica    History of   NSVT (nonsustained ventricular tachycardia) (HCC)    Osteoporosis    Palpitations    occasional   Post-menopausal    Scarlet fever    Sclerosis of the skin    lichen   Splenic artery aneurysm (HCC)    Syncope    Tinnitus    Chronic    Past Surgical History:  Procedure Laterality Date   CATARACT EXTRACTION Bilateral    DENTAL SURGERY     SKIN CANCER EXCISION     TONSILLECTOMY      Social History   Socioeconomic History   Marital status: Married    Spouse name: Not on file   Number of children: 5   Years of education: Not on file   Highest education level: Not on file  Occupational History   Occupation: retired  Tobacco Use   Smoking status: Former    Packs/day:  0.25    Years: 5.00    Pack years: 1.25    Types: Cigarettes    Quit date: 98    Years since quitting: 55.4   Smokeless tobacco: Never   Tobacco comments:    Pt was a some day smoker.  Vaping Use   Vaping Use: Never used  Substance and Sexual Activity   Alcohol use: No    Alcohol/week: 0.0 standard drinks   Drug use: No   Sexual activity: Not Currently    Partners: Male    Birth control/protection: Abstinence, Post-menopausal  Other Topics Concern   Not on file  Social History Narrative   Not on file   Social Determinants of Health   Financial Resource Strain: Not on file  Food Insecurity: Not on file  Transportation Needs: Not on file  Physical Activity: Not on file  Stress: Not on file  Social Connections: Not on file  Intimate Partner Violence: Not on file    Family History  Problem Relation Age of Onset   Heart attack Father    Cancer - Colon Mother 79   Hypertension Mother    Heart  disease Sister    Cancer Son        muscle    ROS: no fevers or chills, productive cough, hemoptysis, dysphasia, odynophagia, melena, hematochezia, dysuria, hematuria, rash, seizure activity, orthopnea, PND, pedal edema, claudication. Remaining systems are negative.  Physical Exam: Well-developed well-nourished in no acute distress.  Skin is warm and dry.  HEENT is normal.  Neck is supple.  Chest is clear to auscultation with normal expansion.  Cardiovascular exam is regular rate and rhythm.  Abdominal exam nontender or distended. No masses palpated. Extremities show no edema. neuro grossly intact  ECG-normal sinus rhythm with PACs, no ST changes.  Personally reviewed  A/P  1 nonsustained ventricular tachycardia-noted on previous monitor.  LV function normal on echocardiogram.  Continue beta-blocker.  She has no symptoms.  2 hypertension-patient's blood pressure is controlled.  Continue present medications.  Kirk Ruths, MD

## 2022-04-04 ENCOUNTER — Other Ambulatory Visit: Payer: Self-pay | Admitting: Internal Medicine

## 2022-04-04 DIAGNOSIS — K5909 Other constipation: Secondary | ICD-10-CM

## 2022-04-07 ENCOUNTER — Ambulatory Visit: Payer: Medicare HMO | Admitting: Cardiology

## 2022-04-07 ENCOUNTER — Encounter: Payer: Self-pay | Admitting: Cardiology

## 2022-04-07 VITALS — BP 140/57 | HR 90 | Ht 62.0 in | Wt 120.2 lb

## 2022-04-07 DIAGNOSIS — I10 Essential (primary) hypertension: Secondary | ICD-10-CM | POA: Diagnosis not present

## 2022-04-07 DIAGNOSIS — R002 Palpitations: Secondary | ICD-10-CM

## 2022-04-07 DIAGNOSIS — I4729 Other ventricular tachycardia: Secondary | ICD-10-CM | POA: Diagnosis not present

## 2022-04-07 NOTE — Patient Instructions (Signed)

## 2022-04-21 DIAGNOSIS — L820 Inflamed seborrheic keratosis: Secondary | ICD-10-CM | POA: Diagnosis not present

## 2022-04-27 DIAGNOSIS — M810 Age-related osteoporosis without current pathological fracture: Secondary | ICD-10-CM | POA: Diagnosis not present

## 2022-05-12 ENCOUNTER — Ambulatory Visit: Payer: Medicare HMO | Admitting: Family Medicine

## 2022-05-12 DIAGNOSIS — H52223 Regular astigmatism, bilateral: Secondary | ICD-10-CM | POA: Diagnosis not present

## 2022-05-12 DIAGNOSIS — H5203 Hypermetropia, bilateral: Secondary | ICD-10-CM | POA: Diagnosis not present

## 2022-05-12 DIAGNOSIS — H524 Presbyopia: Secondary | ICD-10-CM | POA: Diagnosis not present

## 2022-05-12 DIAGNOSIS — H35363 Drusen (degenerative) of macula, bilateral: Secondary | ICD-10-CM | POA: Diagnosis not present

## 2022-05-12 DIAGNOSIS — H43813 Vitreous degeneration, bilateral: Secondary | ICD-10-CM | POA: Diagnosis not present

## 2022-05-12 DIAGNOSIS — Z961 Presence of intraocular lens: Secondary | ICD-10-CM | POA: Diagnosis not present

## 2022-05-20 DIAGNOSIS — I739 Peripheral vascular disease, unspecified: Secondary | ICD-10-CM | POA: Diagnosis not present

## 2022-05-26 ENCOUNTER — Ambulatory Visit (INDEPENDENT_AMBULATORY_CARE_PROVIDER_SITE_OTHER): Payer: Medicare HMO

## 2022-05-26 ENCOUNTER — Ambulatory Visit (INDEPENDENT_AMBULATORY_CARE_PROVIDER_SITE_OTHER): Payer: Medicare HMO | Admitting: Family Medicine

## 2022-05-26 ENCOUNTER — Encounter: Payer: Self-pay | Admitting: Family Medicine

## 2022-05-26 VITALS — BP 150/88 | HR 77 | Ht 62.0 in | Wt 121.2 lb

## 2022-05-26 DIAGNOSIS — M25552 Pain in left hip: Secondary | ICD-10-CM

## 2022-05-26 DIAGNOSIS — M545 Low back pain, unspecified: Secondary | ICD-10-CM

## 2022-05-26 DIAGNOSIS — M1612 Unilateral primary osteoarthritis, left hip: Secondary | ICD-10-CM | POA: Diagnosis not present

## 2022-05-26 DIAGNOSIS — G8929 Other chronic pain: Secondary | ICD-10-CM | POA: Diagnosis not present

## 2022-05-26 DIAGNOSIS — M47816 Spondylosis without myelopathy or radiculopathy, lumbar region: Secondary | ICD-10-CM | POA: Diagnosis not present

## 2022-05-26 NOTE — Patient Instructions (Addendum)
Good to see you today.  Please get an Xray today before you leave L hip  I've referred you to Physical Therapy.  Let us know if you don't hear from them in one week.   Follow-up: 6 weeks

## 2022-05-26 NOTE — Progress Notes (Signed)
   I, Peterson Lombard, LAT, ATC acting as a scribe for Lynne Leader, MD. Subjective:    CC: Back and hip pain  HPI: Pt is a 88 y/o female c/o back and hip pain. Pt was previously seen by Dr. Georgina Snell in 2021 for bilat shoulder pain w/ possible cervical radicular symptoms. Today, pt c/o low back and L hip pain x 2-2.5 months. Pt locates pain to her lower back and her L lateral hip  Radiating pain: no LE numbness/tingling: no LE weakness: no Aggravates: transitioning from sit to supine when trying to get into bed; in/out of car; pushing off of or through the L leg Treatments tried: Tylenol; OTC pain patch; topical  pain-relieving  Diagnostic testing: bone density test w/ Dr. Shelia Media  Pertinent review of Systems: No fevers or chills  Relevant historical information: Hypertension.   Objective:    Vitals:   05/26/22 1319  BP: (!) 150/88  Pulse: 77  SpO2: 98%   General: Well Developed, well nourished, and in no acute distress.   MSK: L-spine: Nontender midline. Decreased lumbar motion. Left hip: Normal appearing Tender to palpation greater trochanter. Hip abduction and external rotation strength is diminished.    Lab and Radiology Results  X-ray images lumbar spine and left hip obtained today personally and independently interpreted.   Lumbar spine: Diffuse multilevel degenerative changes without acute fractures. Degenerative appearing scoliosis.  Left hip: No acute fractures.  Mild to moderate degenerative changes.  Await formal radiology    Impression and Recommendations:    Assessment and Plan: 88 y.o. female with worsening left buttocks pain and low back pain.  This is thought primarily to be tendinopathy of the hip abductor tendons.  X-rays obtained today which certainly do show degenerative changes but do not show acute fractures per my read however radiology overread is still pending.  Plan for physical therapy and check back in 6 weeks.Marland Kitchen  PDMP not reviewed this  encounter. Orders Placed This Encounter  Procedures   DG Lumbar Spine 2-3 Views    Standing Status:   Future    Number of Occurrences:   1    Standing Expiration Date:   06/26/2022    Order Specific Question:   Reason for Exam (SYMPTOM  OR DIAGNOSIS REQUIRED)    Answer:   low back pain    Order Specific Question:   Preferred imaging location?    Answer:   [facility]   DG HIP UNILAT W OR W/O PELVIS 2-3 VIEWS LEFT    Standing Status:   Future    Number of Occurrences:   1    Standing Expiration Date:   06/26/2022    Order Specific Question:   Reason for Exam (SYMPTOM  OR DIAGNOSIS REQUIRED)    Answer:   left hip pain    Order Specific Question:   Preferred imaging location?    Answer:   [facility]   Ambulatory referral to Physical Therapy    Referral Priority:   Routine    Referral Type:   Physical Medicine    Referral Reason:   Specialty Services Required    Requested Specialty:   Physical Therapy    Number of Visits Requested:   1   No orders of the defined types were placed in this encounter.   Discussed warning signs or symptoms. Please see discharge instructions. Patient expresses understanding.   The above documentation has been reviewed and is accurate and complete Lynne Leader, M.D.

## 2022-05-30 NOTE — Progress Notes (Signed)
Left hip x-ray shows bilateral hip arthritis.  No fractures are seen.

## 2022-05-30 NOTE — Progress Notes (Signed)
Lumbar spine x-ray shows extensive multilevel arthritis.  Additionally scoliosis is present.

## 2022-06-08 NOTE — Therapy (Incomplete)
OUTPATIENT PHYSICAL THERAPY THORACOLUMBAR EVALUATION   Patient Name: Katie Rubio MRN: [id number] DOB:1929-01-21, 88 y.o., female Today's Date: 06/08/2022    Past Medical History:  Diagnosis Date   Allergic rhinitis    Anxiety    Atrophic vaginitis    Benign head tremor    CKD (chronic kidney disease) stage 3, GFR 30-59 ml/min (HCC)    Dizziness    Elevated serum glutamic pyruvic transaminase (SGPT) level    History of anxiety    History of rheumatic fever    Hypertension    Lung nodule    Macrocytosis    History of chronic macrocytosis   Melanoma (HCC)    Meralgia paresthetica    History of   NSVT (nonsustained ventricular tachycardia) (HCC)    Osteoporosis    Palpitations    occasional   Post-menopausal    Scarlet fever    Sclerosis of the skin    lichen   Splenic artery aneurysm (HCC)    Syncope    Tinnitus    Chronic   Past Surgical History:  Procedure Laterality Date   CATARACT EXTRACTION Bilateral    DENTAL SURGERY     SKIN CANCER EXCISION     TONSILLECTOMY     Patient Active Problem List   Diagnosis Date Noted   Abnormal gait 08/05/2021   Allergic rhinitis 08/05/2021   Benign essential hypertension 08/05/2021   Cardiac arrhythmia 08/05/2021   Chronic anxiety 08/05/2021   Chronic kidney disease, stage 3a ([place]) 08/05/2021   Chronic pain 08/05/2021   Chronic constipation 08/05/2021   Cramp in lower leg associated with rest 08/05/2021   Essential tremor 08/05/2021   Family history of ischemic heart disease (IHD) 08/05/2021   Osteoporosis    Premature atrial complex 01/28/2019   Elevated blood pressure reading in office without diagnosis of hypertension 01/28/2019   History of rheumatic fever 01/28/2019    PCP: Deland Pretty, MD  REFERRING PROVIDER: Gregor Hams, MD  REFERRING DIAG: M54.50,G89.29 (ICD-10-CM) - Chronic bilateral low back pain without sciatica M25.552 (ICD-10-CM) - Left hip pain  Rationale for Evaluation and Treatment  Rehabilitation  THERAPY DIAG:  No diagnosis found.  ONSET DATE: ***  SUBJECTIVE:                                                                                                                                                                                           SUBJECTIVE STATEMENT: *** PERTINENT HISTORY:  CKD, Anxiety, HTN, osteoporosis  PAIN:  NPRS scale: ***/10 Pain location: *** Pain description: *** Aggravating factors: *** Relieving factors: ***   PRECAUTIONS: None  WEIGHT BEARING RESTRICTIONS No  FALLS:  Has patient fallen in last 6 months? No  LIVING ENVIRONMENT: Lives with: {[facility] lives with:25569::"lives with their family"} Lives in: {Lives in:25570} Stairs: {opstairs:27293} Has following equipment at home: {Assistive devices:23999}  OCCUPATION: ***  PLOF: Independent  PATIENT GOALS ***   OBJECTIVE:   PATIENT SURVEYS:  06/09/2022 FOTO intake:     predicted:     SCREENING FOR RED FLAGS: 06/09/2022 Bowel or bladder incontinence: {MVE/HM:094709628} Spinal tumors: {Yes/No:304960894} Cauda equina syndrome: {Yes/No:304960894} Compression fracture: {Yes/No:304960894} Abdominal aneurysm: {Yes/No:304960894}  COGNITION: 06/09/2022  Overall cognitive status: Within functional limits for tasks assessed     SENSATION: 06/09/2022 {sensation:27233}  MUSCLE LENGTH: 06/09/2022 Hamstrings: Right *** deg; Left *** deg Marcello Moores test: Right *** deg; Left *** deg  POSTURE: 06/09/2022 {posture:25561}  PALPATION: 06/09/2022 ***  LUMBAR ROM:   Active  AROM  06/09/2022  Flexion   Extension   Right lateral flexion   Left lateral flexion   Right rotation   Left rotation    (Blank rows = not tested)  LOWER EXTREMITY ROM:     {AROM/PROM:27142}  Right 06/09/2022 Left 06/09/2022  Hip flexion    Hip extension    Hip abduction    Hip adduction    Hip internal rotation    Hip external rotation    Knee flexion    Knee extension    Ankle dorsiflexion     Ankle plantarflexion    Ankle inversion    Ankle eversion     (Blank rows = not tested)  LOWER EXTREMITY MMT:    MMT Right 06/09/2022 Left 06/09/2022  Hip flexion    Hip extension    Hip abduction    Hip adduction    Hip internal rotation    Hip external rotation    Knee flexion    Knee extension    Ankle dorsiflexion    Ankle plantarflexion    Ankle inversion    Ankle eversion     (Blank rows = not tested)  LUMBAR SPECIAL TESTS:  06/09/2022 {lumbar special test:25242}  FUNCTIONAL TESTS:  06/09/2022 {Functional tests:24029}  GAIT: 06/09/2022 Distance walked: *** Assistive device utilized: {Assistive devices:23999} Level of assistance: {Levels of assistance:24026} Comments: ***    TODAY'S TREATMENT  06/09/2022:  Therex:    HEP instruction/performance c cues for techniques, handout provided.  Trial set performed of each for comprehension and symptom assessment.  See below for exercise list.   PATIENT EDUCATION:  06/09/2022 Education details: HEP, POC Person educated: Patient Education method: Consulting civil engineer, Demonstration, Verbal cues, and Handouts Education comprehension: verbalized understanding, returned demonstration, and verbal cues required   HOME EXERCISE PROGRAM: ***  ASSESSMENT:  CLINICAL IMPRESSION: Patient is a 88 y.o. who comes to clinic with complaints of low back pain with mobility, strength and movement coordination deficits that impair their ability to perform usual daily and recreational functional activities without increase difficulty/symptoms at this time.  Patient to benefit from skilled PT services to address impairments and limitations to improve to previous level of function without restriction secondary to condition.    OBJECTIVE IMPAIRMENTS {opptimpairments:25111}.   ACTIVITY LIMITATIONS {activitylimitations:27494}  PARTICIPATION LIMITATIONS: {participationrestrictions:25113}  PERSONAL FACTORS CKD, Anxiety, HTN, osteoporosis are  also affecting patient's functional outcome.   REHAB POTENTIAL: Good  CLINICAL DECISION MAKING: Stable/uncomplicated  EVALUATION COMPLEXITY: Low   GOALS: Goals reviewed with patient? Yes  Short term PT Goals (target date for Short term goals are *** weeks 06/30/2022) Patient will demonstrate independent use of home exercise program to maintain progress from in clinic  treatments. Goal status: New   Long term PT goals (target dates for all long term goals are *** weeks  *** )  1. Patient will demonstrate/report pain at worst less than or equal to 2/10 to facilitate minimal limitation in daily activity secondary to pain symptoms. Goal status: New  2. Patient will demonstrate independent use of home exercise program to facilitate ability to maintain/progress functional gains from skilled physical therapy services. Goal status: New  3. Patient will demonstrate FOTO outcome > or = *** % to indicate reduced disability due to condition. Goal status: New  4.  Patient will demonstrate lumbar extension 100 % WFL s symptoms to facilitate upright standing, walking posture at PLOF s limitation. Goal status: New  5.  ***   Goal status: New  6.  ***  Goal status: New  7.  *** a.  Goal Status: New   PLAN: PT FREQUENCY: 1-2x/week  PT DURATION: 10 weeks  PLANNED INTERVENTIONS: Therapeutic exercises, Therapeutic activity, Neuro Muscular re-education, Balance training, Gait training, Patient/Family education, Joint mobilization, Stair training, DME instructions, Dry Needling, Electrical stimulation, Cryotherapy, Moist heat, Taping, Ultrasound, Ionotophoresis '4mg'$ /ml Dexamethasone, and Manual therapy.  All included unless contraindicated.  PLAN FOR NEXT SESSION: HEP review/reaction assessment.   Scot Jun, PT, DPT, OCS, ATC 06/08/22  3:33 PM

## 2022-06-09 ENCOUNTER — Other Ambulatory Visit: Payer: Self-pay

## 2022-06-09 ENCOUNTER — Encounter: Payer: Self-pay | Admitting: Rehabilitative and Restorative Service Providers"

## 2022-06-09 ENCOUNTER — Ambulatory Visit: Payer: Medicare HMO | Admitting: Rehabilitative and Restorative Service Providers"

## 2022-06-09 DIAGNOSIS — M5459 Other low back pain: Secondary | ICD-10-CM

## 2022-06-09 DIAGNOSIS — R262 Difficulty in walking, not elsewhere classified: Secondary | ICD-10-CM

## 2022-06-09 DIAGNOSIS — R293 Abnormal posture: Secondary | ICD-10-CM | POA: Diagnosis not present

## 2022-06-09 DIAGNOSIS — M6281 Muscle weakness (generalized): Secondary | ICD-10-CM

## 2022-06-09 DIAGNOSIS — M25552 Pain in left hip: Secondary | ICD-10-CM | POA: Diagnosis not present

## 2022-06-22 DIAGNOSIS — Z7982 Long term (current) use of aspirin: Secondary | ICD-10-CM | POA: Diagnosis not present

## 2022-06-22 DIAGNOSIS — K5909 Other constipation: Secondary | ICD-10-CM | POA: Diagnosis not present

## 2022-06-23 ENCOUNTER — Encounter: Payer: Self-pay | Admitting: Rehabilitative and Restorative Service Providers"

## 2022-06-23 ENCOUNTER — Ambulatory Visit: Payer: Medicare HMO | Admitting: Rehabilitative and Restorative Service Providers"

## 2022-06-23 DIAGNOSIS — M25552 Pain in left hip: Secondary | ICD-10-CM | POA: Diagnosis not present

## 2022-06-23 DIAGNOSIS — R293 Abnormal posture: Secondary | ICD-10-CM

## 2022-06-23 DIAGNOSIS — M6281 Muscle weakness (generalized): Secondary | ICD-10-CM | POA: Diagnosis not present

## 2022-06-23 DIAGNOSIS — R262 Difficulty in walking, not elsewhere classified: Secondary | ICD-10-CM | POA: Diagnosis not present

## 2022-06-23 DIAGNOSIS — M5459 Other low back pain: Secondary | ICD-10-CM | POA: Diagnosis not present

## 2022-06-23 NOTE — Therapy (Signed)
OUTPATIENT PHYSICAL THERAPY TREATMENT NOTE   Patient Name: Katie Rubio MRN: [id number] DOB:[birth year], 88 y.o., female Today's Date: 06/23/2022  PCP: Deland Pretty, MD   REFERRING PROVIDER: Gregor Hams, MD  END OF SESSION:   PT End of Session - 06/23/22 1143     Visit Number 2    Number of Visits 20    Date for PT Re-Evaluation 08/18/22    Authorization Type [employer] [id number] copay    Progress Note Due on Visit 10    PT Start Time 1058    PT Stop Time 1143    PT Time Calculation (min) 45 min    Activity Tolerance Patient tolerated treatment well    Behavior During Therapy WFL for tasks assessed/performed             Past Medical History:  Diagnosis Date   Allergic rhinitis    Anxiety    Atrophic vaginitis    Benign head tremor    CKD (chronic kidney disease) stage 3, GFR 30-59 ml/min (HCC)    Dizziness    Elevated serum glutamic pyruvic transaminase (SGPT) level    History of anxiety    History of rheumatic fever    Hypertension    Lung nodule    Macrocytosis    History of chronic macrocytosis   Melanoma (HCC)    Meralgia paresthetica    History of   NSVT (nonsustained ventricular tachycardia) (HCC)    Osteoporosis    Palpitations    occasional   Post-menopausal    Scarlet fever    Sclerosis of the skin    lichen   Splenic artery aneurysm (HCC)    Syncope    Tinnitus    Chronic   Past Surgical History:  Procedure Laterality Date   CATARACT EXTRACTION Bilateral    DENTAL SURGERY     SKIN CANCER EXCISION     TONSILLECTOMY     Patient Active Problem List   Diagnosis Date Noted   Abnormal gait 08/05/2021   Allergic rhinitis 08/05/2021   Benign essential hypertension 08/05/2021   Cardiac arrhythmia 08/05/2021   Chronic anxiety 08/05/2021   Chronic kidney disease, stage 3a ([place]) 08/05/2021   Chronic pain 08/05/2021   Chronic constipation 08/05/2021   Cramp in lower leg associated with rest 08/05/2021   Essential tremor 08/05/2021   Family  history of ischemic heart disease (IHD) 08/05/2021   Osteoporosis    Premature atrial complex 01/28/2019   Elevated blood pressure reading in office without diagnosis of hypertension 01/28/2019   History of rheumatic fever 01/28/2019    REFERRING DIAG: M54.50,G89.29 (ICD-10-CM) - Chronic bilateral low back pain without sciatica M25.552 (ICD-10-CM) - Left hip pain  THERAPY DIAG:  Other low back pain  Pain in left hip  Muscle weakness (generalized)  Difficulty in walking, not elsewhere classified  Abnormal posture  Rationale for Evaluation and Treatment Rehabilitation  ONSET DATE: approx. 3 months (May 2023)   SUBJECTIVE:  SUBJECTIVE STATEMENT: Pt indicated having similar complaints with problem at bed time.   Pt indicated exercises are ok .  Pt indicated no pain at the moment.     PERTINENT HISTORY:  CKD, Anxiety, HTN, osteoporosis   PAIN:  NPRS scale: up to 10/10 Pain location: back, Lt hip Pain description: achy, fatigue Aggravating factors: transfers into car/bed, lying on Lt/bed mobility Relieving factors: heat/ice, adjusting positions, tylenol occasionally     PRECAUTIONS: None   WEIGHT BEARING RESTRICTIONS No   FALLS:  Has patient fallen in last 6 months? No   LIVING ENVIRONMENT: Lives with: lives with their family 2-3 stairs to enter house with handrail Has following equipment at home: walker use around the house at times    OCCUPATION: Retired   PLOF: Independent, caregiver for husband, self care, light housework/cooking, gardening.  Leaves house usually daily   PATIENT GOALS  Reduce pain, improve walking     OBJECTIVE:                IMAGING REVIEW:             06/09/2022: From MD note:   X-ray images lumbar spine and left hip obtained today personally and  independently interpreted.   Lumbar spine: Diffuse multilevel degenerative changes without acute fractures. Degenerative appearing scoliosis.   Left hip: No acute fractures.  Mild to moderate degenerative changes   PATIENT SURVEYS:  06/09/2022 FOTO intake:  38   predicted:   54   SCREENING FOR RED FLAGS: 06/09/2022 Bowel or bladder incontinence: No Cauda equina syndrome: No     COGNITION: 06/09/2022            Overall cognitive status: Within functional limits for tasks assessed                          SENSATION: 06/09/2022 unremarkable   MUSCLE LENGTH: 06/09/2022 No specific measurements today   POSTURE: 06/09/2022 Increased thoracic kyphosis, Lt lateral trunk lean in sitting/standing, forward head posture, forward trunk lean, reduced lumbar lordosis   PALPATION: 06/09/2022 Tenderness to palpation over Lt glute med muscle   LUMBAR ROM:    Active  AROM  06/09/2022  Flexion WNL  Extension To neutral  Right lateral flexion    Left lateral flexion    Right rotation    Left rotation     (Blank rows = not tested)   LOWER EXTREMITY ROM:        Right 06/09/2022 Left 06/09/2022  Hip flexion      Hip extension      Hip abduction      Hip adduction      Hip internal rotation      Hip external rotation      Knee flexion      Knee extension      Ankle dorsiflexion      Ankle plantarflexion      Ankle inversion      Ankle eversion       (Blank rows = not tested)   LOWER EXTREMITY MMT:     Seated MMT Right 06/09/2022 Left 06/09/2022  Hip flexion 5/5 5/5  Hip extension      Hip abduction 3/5 3-/5  Hip adduction      Hip internal rotation      Hip external rotation      Knee flexion 5/5 5/5  Knee extension 4/5 4/5  Ankle dorsiflexion 5/5 5/5  Ankle plantarflexion  Ankle inversion      Ankle eversion       (Blank rows = not tested)   LUMBAR SPECIAL TESTS:  06/09/2022 No specific testing.    FUNCTIONAL TESTS:  06/09/2022 Timed up and go (TUG): 26 seconds  independently performed     [facility] PT Assessment - 06/09/22 0001                Balance    Balance Assessed Yes          Standardized Balance Assessment    Standardized Balance Assessment Berg Balance Test          Berg Balance Test    Sit to Stand Able to stand  independently using hands     Standing Unsupported Able to stand 2 minutes with supervision     Sitting with Back Unsupported but Feet Supported on Floor or Stool Able to sit safely and securely 2 minutes     Stand to Sit Uses backs of legs against chair to control descent     Transfers Able to transfer safely, definite need of hands     Standing Unsupported with Eyes Closed Able to stand 10 seconds with supervision     Standing Unsupported with Feet Together Able to place feet together independently and stand for 1 minute with supervision     From Standing, Reach Forward with Outstretched Arm Can reach forward >12 cm safely (5")     From Standing Position, Pick up Object from Floor Able to pick up shoe, needs supervision     From Standing Position, Turn to Look Behind Over each Shoulder Needs supervision when turning     Turn 360 Degrees Needs close supervision or verbal cueing     Standing Unsupported, Alternately Place Feet on Step/Stool Needs assistance to keep from falling or unable to try     Standing Unsupported, One Foot in Front Able to take small step independently and hold 30 seconds     Standing on One Leg Tries to lift leg/unable to hold 3 seconds but remains standing independently     Total Score 32                     TRANSFERS: 06/09/2022                     Sit to/from sidelying: independent with scooting legs and bottom and lifting legs to bed when lying to Lt and ModA to lift LLE to bed when lying to Rt, painful when shifting hip and unweighting Lt hip   GAIT: 06/23/2022:  Arrived c SPC use in Lt UE primarily within clinic.   06/09/2022 Household distances < 150 ft within clinic, combination of  independent ambulation and use of daughter's arm for interlocked support.  Similar ambulation posture as noted in standing posture above.  Widened base of support, minimal hip extension bilateral noted c reduced clear in swing.        TODAY'S TREATMENT  06/23/2022:   Manual : compression to superior aspect of Lt glute med c movement  Therex  Seated scapular retraction 2 sec hold x 10   Seated clam shell green band x 20 bilateral   Seated rows green 2x10 bilateral  Seated SLR 2 x 5 bilateral   Sit to stand to sit no UE assist , slow lowering 2 x 5 - 21 inch table height  Nustep lvl5 7 mins UE/LE    Neuro Re-ed  Standing  [employer] anterior/posterior weight shift 1 min x 2  Standing modified tandem 30 sec x 1 bilateral  Feet together stance eyes open 1 min, eyes closed 30 seconds   06/09/2022:             Therex:                          HEP instruction/performance c cues for techniques, handout provided.  Trial set performed of each for comprehension and symptom assessment.  See below for exercise list.     PATIENT EDUCATION:  06/23/2022 Education details: HEP progression Person educated: Patient, daughter Education method: Explanation, Demonstration, Verbal cues, and Handouts Education comprehension: verbalized understanding, returned demonstration, and verbal cues required     HOME EXERCISE PROGRAM: Access Code: [email or web address] URL: [email or web address] Date: 06/23/2022 Prepared by: Scot Jun  Exercises - Seated Gluteal Sets  - 2-3 x daily - 7 x weekly - 10-15 reps - Seated Single Leg Hip Abduction with Resistance  - 2-3 x daily - 7 x weekly - 10-15 reps - Seated Scapular Retraction  - 3-5 x daily - 7 x weekly - 10-15 reps - 3-5sec hold - Sit to Stand  - 1 x daily - 7 x weekly - 1-2 sets - 5-10 reps - Seated Straight Leg Heel Taps  - 1-2 x daily - 7 x weekly - 3 sets - 5 reps - Seated Shoulder Row with Anchored Resistance  - 1-2 x daily - 7 x weekly - 1-2  sets - 10 reps   ASSESSMENT:   CLINICAL IMPRESSION: Patient is a 88 y.o. who comes to clinic with complaints of low back pain with mobility, strength and movement coordination deficits that impair their ability to perform usual daily and recreational functional activities without increase difficulty/symptoms at this time.  Patient to benefit from skilled PT services to address impairments and limitations to improve to previous level of function without restriction secondary to condition.      OBJECTIVE IMPAIRMENTS Abnormal gait, decreased activity tolerance, decreased balance, decreased coordination, decreased endurance, decreased mobility, difficulty walking, decreased ROM, decreased strength, hypomobility, increased fascial restrictions, impaired perceived functional ability, impaired flexibility, improper body mechanics, postural dysfunction, and pain.    ACTIVITY LIMITATIONS carrying, lifting, bending, standing, squatting, sleeping, stairs, transfers, bed mobility, and locomotion level   PARTICIPATION LIMITATIONS: cleaning, interpersonal relationship, shopping, community activity, and yard work   PERSONAL FACTORS CKD, Anxiety, HTN, osteoporosis are also affecting patient's functional outcome.    REHAB POTENTIAL: Good   CLINICAL DECISION MAKING: Evolving/moderate complexity   EVALUATION COMPLEXITY: Moderate     GOALS: Goals reviewed with patient? Yes   Short term PT Goals (target date for Short term goals are 3 weeks 06/30/2022) Patient will demonstrate independent use of home exercise program to maintain progress from in clinic treatments. Goal status: on going -  assessed 06/23/2022   Long term PT goals (target dates for all long term goals are 10 weeks  08/18/2022 )   1. Patient will demonstrate/report pain at worst less than or equal to 2/10 to facilitate minimal limitation in daily activity secondary to pain symptoms. Goal status: New   2. Patient will demonstrate independent  use of home exercise program to facilitate ability to maintain/progress functional gains from skilled physical therapy services. Goal status: New   3. Patient will demonstrate FOTO outcome > or = 54 % to indicate reduced disability due to condition. Goal status: New   4.  Patient  will demonstrate lumbar extension 25 or greater % WFL s symptoms to facilitate upright standing, walking posture at PLOF s limitation. Goal status: New   5.  Patient will demonstrate bilateral knee extension MMT 5/5, hip abduction > or = 4/5 to faciitate transfers, mobility independently without restriction.   Goal status: New   6.  Patient will demonstrate BERG scoring > 45 to indicate reduced fall risk.   Goal status: New   7.  Patient will demonstrate TUG < = 14 seconds to indicate reduced fall risk for mobility improvements.  a.  Goal Status: New     PLAN: PT FREQUENCY: 1-2x/week   PT DURATION: 10 weeks   PLANNED INTERVENTIONS: Therapeutic exercises, Therapeutic activity, Neuro Muscular re-education, Balance training, Gait training, Patient/Family education, Joint mobilization, Stair training, DME instructions, Dry Needling, Electrical stimulation, Cryotherapy, Moist heat, Taping, Ultrasound, Ionotophoresis '4mg'$ /ml Dexamethasone, and Manual therapy.  All included unless contraindicated.   PLAN FOR NEXT SESSION: Progressive strengthening, balance improvements.   Scot Jun, PT, DPT, OCS, ATC 06/23/22  11:45 AM

## 2022-06-27 ENCOUNTER — Encounter: Payer: Medicare HMO | Admitting: Rehabilitative and Restorative Service Providers"

## 2022-06-30 ENCOUNTER — Encounter: Payer: Medicare HMO | Admitting: Rehabilitative and Restorative Service Providers"

## 2022-07-01 NOTE — Therapy (Addendum)
OUTPATIENT PHYSICAL THERAPY TREATMENT NOTE   Patient Name: Katie Rubio MRN: [id number] DOB:1929/09/19, 88 y.o., female Today's Date: 07/04/2022  PCP: Deland Pretty, MD   REFERRING PROVIDER: Gregor Hams, MD  END OF SESSION:   PT End of Session - 07/04/22 1202     Visit Number 3    Number of Visits 20    Date for PT Re-Evaluation 08/18/22    Authorization Type [employer] $[id number] copay    Progress Note Due on Visit 10    PT Start Time 1107    PT Stop Time 1145    PT Time Calculation (min) 38 min    Equipment Utilized During Treatment Gait belt    Activity Tolerance Patient tolerated treatment well    Behavior During Therapy WFL for tasks assessed/performed              Past Medical History:  Diagnosis Date   Allergic rhinitis    Anxiety    Atrophic vaginitis    Benign head tremor    CKD (chronic kidney disease) stage 3, GFR 30-59 ml/min (HCC)    Dizziness    Elevated serum glutamic pyruvic transaminase (SGPT) level    History of anxiety    History of rheumatic fever    Hypertension    Lung nodule    Macrocytosis    History of chronic macrocytosis   Melanoma (HCC)    Meralgia paresthetica    History of   NSVT (nonsustained ventricular tachycardia) (HCC)    Osteoporosis    Palpitations    occasional   Post-menopausal    Scarlet fever    Sclerosis of the skin    lichen   Splenic artery aneurysm (HCC)    Syncope    Tinnitus    Chronic   Past Surgical History:  Procedure Laterality Date   CATARACT EXTRACTION Bilateral    DENTAL SURGERY     SKIN CANCER EXCISION     TONSILLECTOMY     Patient Active Problem List   Diagnosis Date Noted   Abnormal gait 08/05/2021   Allergic rhinitis 08/05/2021   Benign essential hypertension 08/05/2021   Cardiac arrhythmia 08/05/2021   Chronic anxiety 08/05/2021   Chronic kidney disease, stage 3a ([place]) 08/05/2021   Chronic pain 08/05/2021   Chronic constipation 08/05/2021   Cramp in lower leg associated with rest  08/05/2021   Essential tremor 08/05/2021   Family history of ischemic heart disease (IHD) 08/05/2021   Osteoporosis    Premature atrial complex 01/28/2019   Elevated blood pressure reading in office without diagnosis of hypertension 01/28/2019   History of rheumatic fever 01/28/2019    REFERRING DIAG: M54.50,G89.29 (ICD-10-CM) - Chronic bilateral low back pain without sciatica M25.552 (ICD-10-CM) - Left hip pain  THERAPY DIAG:  Other low back pain  Pain in left hip  Muscle weakness (generalized)  Difficulty in walking, not elsewhere classified  Abnormal posture  Rationale for Evaluation and Treatment Rehabilitation  ONSET DATE: approx. 3 months (May 2023)   SUBJECTIVE:  SUBJECTIVE STATEMENT: Pt indicated having similar complaints with problem at bed time and getting in/out of a car. Pt indicated no pain at the moment.  Daughter is present with patient to review exercises.    PERTINENT HISTORY:  CKD, Anxiety, HTN, osteoporosis   PAIN:  NPRS scale: up to 10/10 Pain location: back, Lt hip Pain description: achy, fatigue Aggravating factors: transfers into car/bed, lying on Lt/bed mobility Relieving factors: heat/ice, adjusting positions, tylenol occasionally     PRECAUTIONS: None   WEIGHT BEARING RESTRICTIONS No   FALLS:  Has patient fallen in last 6 months? No   LIVING ENVIRONMENT: Lives with: lives with their family 2-3 stairs to enter house with handrail Has following equipment at home: walker use around the house at times    OCCUPATION: Retired   PLOF: Independent, caregiver for husband, self care, light housework/cooking, gardening.  Leaves house usually daily   PATIENT GOALS  Reduce pain, improve walking     OBJECTIVE:                IMAGING REVIEW:              06/09/2022: From MD note:  X-ray images lumbar spine and left hip obtained today personally and independently interpreted.   Lumbar spine: Diffuse multilevel degenerative changes without acute fractures. Degenerative appearing scoliosis.   Left hip: No acute fractures.  Mild to moderate degenerative changes   PATIENT SURVEYS:  06/09/2022 FOTO intake:  38   predicted:   54   SCREENING FOR RED FLAGS: 06/09/2022 Bowel or bladder incontinence: No Cauda equina syndrome: No     COGNITION: 06/09/2022            Overall cognitive status: Within functional limits for tasks assessed                          SENSATION: 06/09/2022 unremarkable   MUSCLE LENGTH: 06/09/2022 No specific measurements today   POSTURE: 06/09/2022 Increased thoracic kyphosis, Lt lateral trunk lean in sitting/standing, forward head posture, forward trunk lean, reduced lumbar lordosis   PALPATION: 06/09/2022 Tenderness to palpation over Lt glute med muscle   LUMBAR ROM:    Active  AROM  06/09/2022  Flexion WNL  Extension To neutral  Right lateral flexion    Left lateral flexion    Right rotation    Left rotation     (Blank rows = not tested)    LOWER EXTREMITY MMT:     Seated MMT Right 06/09/2022 Left 06/09/2022  Hip flexion 5/5 5/5  Hip extension      Hip abduction 3/5 3-/5  Hip adduction      Hip internal rotation      Hip external rotation      Knee flexion 5/5 5/5  Knee extension 4/5 4/5  Ankle dorsiflexion 5/5 5/5  Ankle plantarflexion      Ankle inversion      Ankle eversion       (Blank rows = not tested)   LUMBAR SPECIAL TESTS:  06/09/2022 No specific testing.    FUNCTIONAL TESTS:  06/09/2022 Timed up and go (TUG): 26 seconds independently performed     [facility] PT Assessment - 06/09/22 0001                Balance    Balance Assessed Yes          Standardized Balance Assessment    Standardized Balance Assessment Berg Balance Test  Berg Balance Test    Sit to Stand Able to  stand  independently using hands     Standing Unsupported Able to stand 2 minutes with supervision     Sitting with Back Unsupported but Feet Supported on Floor or Stool Able to sit safely and securely 2 minutes     Stand to Sit Uses backs of legs against chair to control descent     Transfers Able to transfer safely, definite need of hands     Standing Unsupported with Eyes Closed Able to stand 10 seconds with supervision     Standing Unsupported with Feet Together Able to place feet together independently and stand for 1 minute with supervision     From Standing, Reach Forward with Outstretched Arm Can reach forward >12 cm safely (5")     From Standing Position, Pick up Object from [place] to pick up shoe, needs supervision     From Standing Position, Turn to Look Behind Over each Shoulder Needs supervision when turning     Turn 360 Degrees Needs close supervision or verbal cueing     Standing Unsupported, Alternately Place Feet on Step/Stool Needs assistance to keep from falling or unable to try     Standing Unsupported, One Foot in Front Able to take small step independently and hold 30 seconds     Standing on One Leg Tries to lift leg/unable to hold 3 seconds but remains standing independently     Total Score 32                     TRANSFERS: 06/09/2022                     Sit to/from sidelying: independent with scooting legs and bottom and lifting legs to bed when lying to Lt and ModA to lift LLE to bed when lying to Rt, painful when shifting hip and unweighting Lt hip   GAIT: 06/23/2022:  Arrived c SPC use in Lt UE primarily within clinic.   06/09/2022 Household distances < 150 ft within clinic, combination of independent ambulation and use of daughter's arm for interlocked support.  Similar ambulation posture as noted in standing posture above.  Widened base of support, minimal hip extension bilateral noted c reduced clear in swing.        TODAY'S TREATMENT  07/04/2022:    Therex  Seated scapular retraction 2 sec hold x 10   Seated clam shell green band x 20 bilateral   Seated rows green 2x10 bilateral  Seated SLR 2 x 5 bilateral   Sit to stand to sit no UE assist , slow lowering 2 x 5 - 21 inch table height  Nustep lvl5 7 mins UE/LE  Neuro Re-ed  Standing church pew anterior/posterior weight shift 1 min x 2  Standing modified tandem 30 sec x 1 bilateral  Feet together stance eyes open 1 min, eyes closed 30 seconds 06/23/2022:   Manual : compression to superior aspect of Lt glute med c movement  Therex  Seated scapular retraction 2 sec hold x 10   Seated clam shell green band x 20 bilateral   Seated rows green 2x10 bilateral  Seated SLR 2 x 5 bilateral   Sit to stand to sit no UE assist , slow lowering 2 x 5 - 21 inch table height  Nustep lvl5 7 mins UE/LE    Neuro Re-ed  Standing church pew anterior/posterior weight shift 1 min x 2  Standing modified tandem 30 sec x 1 bilateral  Feet together stance eyes open 1 min, eyes closed 30 seconds   06/09/2022:             Therex:                          HEP instruction/performance c cues for techniques, handout provided.  Trial set performed of each for comprehension and symptom assessment.  See below for exercise list.     PATIENT EDUCATION:  06/23/2022 Education details: HEP progression Person educated: Patient, daughter Education method: Explanation, Demonstration, Verbal cues, and Handouts Education comprehension: verbalized understanding, returned demonstration, and verbal cues required     HOME EXERCISE PROGRAM: Access Code: [email or web address] URL: [email or web address] Date: 06/23/2022 Prepared by: Scot Jun  Exercises - Seated Gluteal Sets  - 2-3 x daily - 7 x weekly - 10-15 reps - Seated Single Leg Hip Abduction with Resistance  - 2-3 x daily - 7 x weekly - 10-15 reps - Seated Scapular Retraction  - 3-5 x daily - 7 x weekly - 10-15 reps - 3-5sec hold - Sit to Stand  - 1 x  daily - 7 x weekly - 1-2 sets - 5-10 reps - Seated Straight Leg Heel Taps  - 1-2 x daily - 7 x weekly - 3 sets - 5 reps - Seated Shoulder Row with Anchored Resistance  - 1-2 x daily - 7 x weekly - 1-2 sets - 10 reps   ASSESSMENT:   CLINICAL IMPRESSION: Patient presents to PT with her daughter today. She states that she has been active with her HEP, but continues to note the most pain with transfers in/ out of bed and her car. Pt demonstrates improved strength and balance today with the ability to perform a sit to stand from a lowered (19") surface. Today's session with continued focus on proximal hip strengthening, and balance. Pt reports some tension noted in posterior knee upon standing sometimes. Educated pt and daughter on HS tightness and stretches to help alleviate tension. Pt reports slight improvements, but requires verbal and tacitle cues for proper forum. Pt tolerated all prescribed exercises well today with no adverse effects. Daughter provided a lot of verbal encouragement throughout session. Pt will continue to benefit from skilled PT to address continued deficits.      OBJECTIVE IMPAIRMENTS Abnormal gait, decreased activity tolerance, decreased balance, decreased coordination, decreased endurance, decreased mobility, difficulty walking, decreased ROM, decreased strength, hypomobility, increased fascial restrictions, impaired perceived functional ability, impaired flexibility, improper body mechanics, postural dysfunction, and pain.    ACTIVITY LIMITATIONS carrying, lifting, bending, standing, squatting, sleeping, stairs, transfers, bed mobility, and locomotion level   PARTICIPATION LIMITATIONS: cleaning, interpersonal relationship, shopping, community activity, and yard work   PERSONAL FACTORS CKD, Anxiety, HTN, osteoporosis are also affecting patient's functional outcome.    REHAB POTENTIAL: Good   CLINICAL DECISION MAKING: Evolving/moderate complexity   EVALUATION COMPLEXITY:  Moderate     GOALS: Goals reviewed with patient? Yes   Short term PT Goals (target date for Short term goals are 3 weeks 06/30/2022) Patient will demonstrate independent use of home exercise program to maintain progress from in clinic treatments. Goal status: on going -  assessed 06/23/2022   Long term PT goals (target dates for all long term goals are 10 weeks  08/18/2022 )   1. Patient will demonstrate/report pain at worst less than or equal to 2/10 to facilitate minimal limitation in daily activity secondary to  pain symptoms. Goal status: New   2. Patient will demonstrate independent use of home exercise program to facilitate ability to maintain/progress functional gains from skilled physical therapy services. Goal status: New   3. Patient will demonstrate FOTO outcome > or = 54 % to indicate reduced disability due to condition. Goal status: New   4.  Patient will demonstrate lumbar extension 25 or greater % WFL s symptoms to facilitate upright standing, walking posture at PLOF s limitation. Goal status: New   5.  Patient will demonstrate bilateral knee extension MMT 5/5, hip abduction > or = 4/5 to faciitate transfers, mobility independently without restriction.   Goal status: New   6.  Patient will demonstrate BERG scoring > 45 to indicate reduced fall risk.   Goal status: New   7.  Patient will demonstrate TUG < = 14 seconds to indicate reduced fall risk for mobility improvements.  a.  Goal Status: New     PLAN: PT FREQUENCY: 1-2x/week   PT DURATION: 10 weeks   PLANNED INTERVENTIONS: Therapeutic exercises, Therapeutic activity, Neuro Muscular re-education, Balance training, Gait training, Patient/Family education, Joint mobilization, Stair training, DME instructions, Dry Needling, Electrical stimulation, Cryotherapy, Moist heat, Taping, Ultrasound, Ionotophoresis '4mg'$ /ml Dexamethasone, and Manual therapy.  All included unless contraindicated.   PLAN FOR NEXT SESSION:  Progressive strengthening, balance improvements.   Rudi Heap PT, DPT 07/04/22  12:19 PM

## 2022-07-04 ENCOUNTER — Encounter: Payer: Self-pay | Admitting: Physical Therapy

## 2022-07-04 ENCOUNTER — Ambulatory Visit: Payer: Medicare HMO | Admitting: Physical Therapy

## 2022-07-04 DIAGNOSIS — R262 Difficulty in walking, not elsewhere classified: Secondary | ICD-10-CM | POA: Diagnosis not present

## 2022-07-04 DIAGNOSIS — M5459 Other low back pain: Secondary | ICD-10-CM

## 2022-07-04 DIAGNOSIS — M6281 Muscle weakness (generalized): Secondary | ICD-10-CM

## 2022-07-04 DIAGNOSIS — M25552 Pain in left hip: Secondary | ICD-10-CM

## 2022-07-04 DIAGNOSIS — R293 Abnormal posture: Secondary | ICD-10-CM

## 2022-07-04 DIAGNOSIS — R269 Unspecified abnormalities of gait and mobility: Secondary | ICD-10-CM

## 2022-07-05 NOTE — Therapy (Signed)
OUTPATIENT PHYSICAL THERAPY TREATMENT NOTE   Patient Name: Katie Rubio MRN: [id number] DOB:1929-08-19, 88 y.o., female Today's Date: 07/07/2022  PCP: Deland Pretty, MD   REFERRING PROVIDER: Gregor Hams, MD  END OF SESSION:   PT End of Session - 07/07/22 1050     Visit Number 4    Number of Visits 20    Date for PT Re-Evaluation 08/18/22    Authorization Type [employer] $[id number] copay    Progress Note Due on Visit 10    PT Start Time 1019    PT Stop Time 1058    PT Time Calculation (min) 39 min    Activity Tolerance Patient tolerated treatment well    Behavior During Therapy WFL for tasks assessed/performed               Past Medical History:  Diagnosis Date   Allergic rhinitis    Anxiety    Atrophic vaginitis    Benign head tremor    CKD (chronic kidney disease) stage 3, GFR 30-59 ml/min (HCC)    Dizziness    Elevated serum glutamic pyruvic transaminase (SGPT) level    History of anxiety    History of rheumatic fever    Hypertension    Lung nodule    Macrocytosis    History of chronic macrocytosis   Melanoma (HCC)    Meralgia paresthetica    History of   NSVT (nonsustained ventricular tachycardia) (HCC)    Osteoporosis    Palpitations    occasional   Post-menopausal    Scarlet fever    Sclerosis of the skin    lichen   Splenic artery aneurysm (HCC)    Syncope    Tinnitus    Chronic   Past Surgical History:  Procedure Laterality Date   CATARACT EXTRACTION Bilateral    DENTAL SURGERY     SKIN CANCER EXCISION     TONSILLECTOMY     Patient Active Problem List   Diagnosis Date Noted   Abnormal gait 08/05/2021   Allergic rhinitis 08/05/2021   Benign essential hypertension 08/05/2021   Cardiac arrhythmia 08/05/2021   Chronic anxiety 08/05/2021   Chronic kidney disease, stage 3a ([place]) 08/05/2021   Chronic pain 08/05/2021   Chronic constipation 08/05/2021   Cramp in lower leg associated with rest 08/05/2021   Essential tremor 08/05/2021    Family history of ischemic heart disease (IHD) 08/05/2021   Osteoporosis    Premature atrial complex 01/28/2019   Elevated blood pressure reading in office without diagnosis of hypertension 01/28/2019   History of rheumatic fever 01/28/2019    REFERRING DIAG: M54.50,G89.29 (ICD-10-CM) - Chronic bilateral low back pain without sciatica M25.552 (ICD-10-CM) - Left hip pain  THERAPY DIAG:  Abnormal gait  Other low back pain  Pain in left hip  Muscle weakness (generalized)  Difficulty in walking, not elsewhere classified  Abnormal posture  Rationale for Evaluation and Treatment Rehabilitation  ONSET DATE: approx. 3 months (May 2023)   SUBJECTIVE:  SUBJECTIVE STATEMENT: Pt states that she was able to work in the garden yesterday and felt comfortable walking on different surfaces and textures.  Daughter is present with patient to review exercises.    PERTINENT HISTORY:  CKD, Anxiety, HTN, osteoporosis   PAIN:  NPRS scale: up to 10/10 Pain location: back, Lt hip Pain description: achy, fatigue Aggravating factors: transfers into car/bed, lying on Lt/bed mobility Relieving factors: heat/ice, adjusting positions, tylenol occasionally     PRECAUTIONS: None   WEIGHT BEARING RESTRICTIONS No   FALLS:  Has patient fallen in last 6 months? No   LIVING ENVIRONMENT: Lives with: lives with their family 2-3 stairs to enter house with handrail Has following equipment at home: walker use around the house at times    OCCUPATION: Retired   PLOF: Independent, caregiver for husband, self care, light housework/cooking, gardening.  Leaves house usually daily   PATIENT GOALS  Reduce pain, improve walking     OBJECTIVE:                IMAGING REVIEW:             06/09/2022: From MD note:  X-ray  images lumbar spine and left hip obtained today personally and independently interpreted.   Lumbar spine: Diffuse multilevel degenerative changes without acute fractures. Degenerative appearing scoliosis.   Left hip: No acute fractures.  Mild to moderate degenerative changes   PATIENT SURVEYS:  06/09/2022 FOTO intake:  38   predicted:   54   SCREENING FOR RED FLAGS: 06/09/2022 Bowel or bladder incontinence: No Cauda equina syndrome: No     COGNITION: 06/09/2022            Overall cognitive status: Within functional limits for tasks assessed                          SENSATION: 06/09/2022 unremarkable   MUSCLE LENGTH: 06/09/2022 No specific measurements today   POSTURE: 06/09/2022 Increased thoracic kyphosis, Lt lateral trunk lean in sitting/standing, forward head posture, forward trunk lean, reduced lumbar lordosis   PALPATION: 06/09/2022 Tenderness to palpation over Lt glute med muscle   LUMBAR ROM:    Active  AROM  06/09/2022  Flexion WNL  Extension To neutral  Right lateral flexion    Left lateral flexion    Right rotation    Left rotation     (Blank rows = not tested)    LOWER EXTREMITY MMT:     Seated MMT Right 06/09/2022 Left 06/09/2022  Hip flexion 5/5 5/5  Hip extension      Hip abduction 3/5 3-/5  Hip adduction      Hip internal rotation      Hip external rotation      Knee flexion 5/5 5/5  Knee extension 4/5 4/5  Ankle dorsiflexion 5/5 5/5  Ankle plantarflexion      Ankle inversion      Ankle eversion       (Blank rows = not tested)   LUMBAR SPECIAL TESTS:  06/09/2022 No specific testing.    FUNCTIONAL TESTS:  06/09/2022 Timed up and go (TUG): 26 seconds independently performed     [facility] PT Assessment - 06/09/22 0001                Balance    Balance Assessed Yes          Standardized Balance Assessment    Standardized Balance Assessment Berg Balance Test  Berg Balance Test    Sit to Stand Able to stand  independently using  hands     Standing Unsupported Able to stand 2 minutes with supervision     Sitting with Back Unsupported but Feet Supported on Floor or Stool Able to sit safely and securely 2 minutes     Stand to Sit Uses backs of legs against chair to control descent     Transfers Able to transfer safely, definite need of hands     Standing Unsupported with Eyes Closed Able to stand 10 seconds with supervision     Standing Unsupported with Feet Together Able to place feet together independently and stand for 1 minute with supervision     From Standing, Reach Forward with Outstretched Arm Can reach forward >12 cm safely (5")     From Standing Position, Pick up Object from [place] to pick up shoe, needs supervision     From Standing Position, Turn to Look Behind Over each Shoulder Needs supervision when turning     Turn 360 Degrees Needs close supervision or verbal cueing     Standing Unsupported, Alternately Place Feet on Step/Stool Needs assistance to keep from falling or unable to try     Standing Unsupported, One Foot in Front Able to take small step independently and hold 30 seconds     Standing on One Leg Tries to lift leg/unable to hold 3 seconds but remains standing independently     Total Score 32                     TRANSFERS: 06/09/2022                     Sit to/from sidelying: independent with scooting legs and bottom and lifting legs to bed when lying to Lt and ModA to lift LLE to bed when lying to Rt, painful when shifting hip and unweighting Lt hip   GAIT: 06/23/2022:  Arrived c SPC use in Lt UE primarily within clinic.   06/09/2022 Household distances < 150 ft within clinic, combination of independent ambulation and use of daughter's arm for interlocked support.  Similar ambulation posture as noted in standing posture above.  Widened base of support, minimal hip extension bilateral noted c reduced clear in swing.        TODAY'S TREATMENT  07/07/2022:   Therex  Seated scapular  retraction 2 sec hold x 10   Seated clam shell green band x 20 bilateral   Seated rows green 2x10 bilateral  Seated marching with GTB 2 x 1 min  Seated LAQ 2 x 5 bilateral   Sit to stand to sit no UE assist , slow lowering 2 x 5 - 21 inch table height  Nustep lvl5 10 mins UE/LE  07/04/2022:   Therex  Seated scapular retraction 2 sec hold x 10   Seated clam shell green band x 20 bilateral   Seated rows green 2x10 bilateral  Seated SLR 2 x 5 bilateral   Sit to stand to sit no UE assist , slow lowering 2 x 5 - 21 inch table height  Nustep lvl5 7 mins UE/LE  Neuro Re-ed  Standing church pew anterior/posterior weight shift 1 min x 2  Standing modified tandem 30 sec x 1 bilateral  Feet together stance eyes open 1 min, eyes closed 30 seconds 06/23/2022:   Manual : compression to superior aspect of Lt glute med c movement  Therex  Seated  scapular retraction 2 sec hold x 10   Seated clam shell green band x 20 bilateral   Seated rows green 2x10 bilateral  Seated SLR 2 x 5 bilateral   Sit to stand to sit no UE assist , slow lowering 2 x 5 - 21 inch table height  Nustep lvl5 7 mins UE/LE    Neuro Re-ed  Standing [employer] anterior/posterior weight shift 1 min x 2  Standing modified tandem 30 sec x 1 bilateral  Feet together stance eyes open 1 min, eyes closed 30 seconds     PATIENT EDUCATION:  06/23/2022 Education details: HEP progression Person educated: Patient, daughter Education method: Explanation, Demonstration, Verbal cues, and Handouts Education comprehension: verbalized understanding, returned demonstration, and verbal cues required     HOME EXERCISE PROGRAM: Access Code: [email or web address] URL: [email or web address] Date: 06/23/2022 Prepared by: Scot Jun  Exercises - Seated Gluteal Sets  - 2-3 x daily - 7 x weekly - 10-15 reps - Seated Single Leg Hip Abduction with Resistance  - 2-3 x daily - 7 x weekly - 10-15 reps - Seated Scapular Retraction  - 3-5 x  daily - 7 x weekly - 10-15 reps - 3-5sec hold - Sit to Stand  - 1 x daily - 7 x weekly - 1-2 sets - 5-10 reps - Seated Straight Leg Heel Taps  - 1-2 x daily - 7 x weekly - 3 sets - 5 reps - Seated Shoulder Row with Anchored Resistance  - 1-2 x daily - 7 x weekly - 1-2 sets - 10 reps   ASSESSMENT:   CLINICAL IMPRESSION: Patient presents to PT with her daughter today. She states that she has been active with her HEP, but continues to note the most pain with transfers in/ out of bed and her car. Pt tolerated all prescribed exercises well today with frequent cues for proper forum. Pt with tendency to slouch. Pt requested to go for increased time on NuStep today. Daughter provided a lot of verbal encouragement throughout session. Plan to continue with balance activities next session. Pt will continue to benefit from skilled PT to address continued deficits.      OBJECTIVE IMPAIRMENTS Abnormal gait, decreased activity tolerance, decreased balance, decreased coordination, decreased endurance, decreased mobility, difficulty walking, decreased ROM, decreased strength, hypomobility, increased fascial restrictions, impaired perceived functional ability, impaired flexibility, improper body mechanics, postural dysfunction, and pain.    ACTIVITY LIMITATIONS carrying, lifting, bending, standing, squatting, sleeping, stairs, transfers, bed mobility, and locomotion level   PARTICIPATION LIMITATIONS: cleaning, interpersonal relationship, shopping, community activity, and yard work   PERSONAL FACTORS CKD, Anxiety, HTN, osteoporosis are also affecting patient's functional outcome.    REHAB POTENTIAL: Good   CLINICAL DECISION MAKING: Evolving/moderate complexity   EVALUATION COMPLEXITY: Moderate     GOALS: Goals reviewed with patient? Yes   Short term PT Goals (target date for Short term goals are 3 weeks 06/30/2022) Patient will demonstrate independent use of home exercise program to maintain progress from  in clinic treatments. Goal status: on going -  assessed 06/23/2022   Long term PT goals (target dates for all long term goals are 10 weeks  08/18/2022 )   1. Patient will demonstrate/report pain at worst less than or equal to 2/10 to facilitate minimal limitation in daily activity secondary to pain symptoms. Goal status: ONGOING, pt reports 5/10 on 07/07/2022   2. Patient will demonstrate independent use of home exercise program to facilitate ability to maintain/progress functional gains from skilled physical therapy services. Goal  status: ONGOING   3. Patient will demonstrate FOTO outcome > or = 54 % to indicate reduced disability due to condition. Goal status: New   4.  Patient will demonstrate lumbar extension 25 or greater % WFL s symptoms to facilitate upright standing, walking posture at PLOF s limitation. Goal status: New   5.  Patient will demonstrate bilateral knee extension MMT 5/5, hip abduction > or = 4/5 to faciitate transfers, mobility independently without restriction.   Goal status: ONGOING   6.  Patient will demonstrate BERG scoring > 45 to indicate reduced fall risk.   Goal status: New   7.  Patient will demonstrate TUG < = 14 seconds to indicate reduced fall risk for mobility improvements.  a.  Goal Status: New     PLAN: PT FREQUENCY: 1-2x/week   PT DURATION: 10 weeks   PLANNED INTERVENTIONS: Therapeutic exercises, Therapeutic activity, Neuro Muscular re-education, Balance training, Gait training, Patient/Family education, Joint mobilization, Stair training, DME instructions, Dry Needling, Electrical stimulation, Cryotherapy, Moist heat, Taping, Ultrasound, Ionotophoresis '4mg'$ /ml Dexamethasone, and Manual therapy.  All included unless contraindicated.   PLAN FOR NEXT SESSION: Progressive strengthening, balance improvements.   Rudi Heap PT, DPT 07/07/22  10:52 AM

## 2022-07-07 ENCOUNTER — Ambulatory Visit: Payer: Medicare HMO | Admitting: Physical Therapy

## 2022-07-07 ENCOUNTER — Ambulatory Visit: Payer: Medicare HMO | Admitting: Family Medicine

## 2022-07-07 DIAGNOSIS — R262 Difficulty in walking, not elsewhere classified: Secondary | ICD-10-CM

## 2022-07-07 DIAGNOSIS — M6281 Muscle weakness (generalized): Secondary | ICD-10-CM

## 2022-07-07 DIAGNOSIS — M5459 Other low back pain: Secondary | ICD-10-CM

## 2022-07-07 DIAGNOSIS — R293 Abnormal posture: Secondary | ICD-10-CM

## 2022-07-07 DIAGNOSIS — R269 Unspecified abnormalities of gait and mobility: Secondary | ICD-10-CM

## 2022-07-07 DIAGNOSIS — M25552 Pain in left hip: Secondary | ICD-10-CM | POA: Diagnosis not present

## 2022-07-13 ENCOUNTER — Ambulatory Visit: Payer: Medicare HMO | Admitting: Rehabilitative and Restorative Service Providers"

## 2022-07-13 ENCOUNTER — Encounter: Payer: Self-pay | Admitting: Rehabilitative and Restorative Service Providers"

## 2022-07-13 DIAGNOSIS — R293 Abnormal posture: Secondary | ICD-10-CM

## 2022-07-13 DIAGNOSIS — M6281 Muscle weakness (generalized): Secondary | ICD-10-CM

## 2022-07-13 DIAGNOSIS — M25552 Pain in left hip: Secondary | ICD-10-CM

## 2022-07-13 DIAGNOSIS — M5459 Other low back pain: Secondary | ICD-10-CM

## 2022-07-13 DIAGNOSIS — R262 Difficulty in walking, not elsewhere classified: Secondary | ICD-10-CM | POA: Diagnosis not present

## 2022-07-13 NOTE — Therapy (Signed)
OUTPATIENT PHYSICAL THERAPY TREATMENT NOTE   Patient Name: Katie Rubio MRN: [id number] DOB:[birth year], 88 y.o., female Today's Date: 07/13/2022  PCP: Deland Pretty, MD   REFERRING PROVIDER: Gregor Hams, MD  END OF SESSION:   PT End of Session - 07/13/22 1205     Visit Number 5    Number of Visits 20    Date for PT Re-Evaluation 08/18/22    Authorization Type [employer] [id number] copay    Progress Note Due on Visit 10    PT Start Time 1101    PT Stop Time 1145    PT Time Calculation (min) 44 min    Activity Tolerance Patient tolerated treatment well    Behavior During Therapy WFL for tasks assessed/performed                Past Medical History:  Diagnosis Date   Allergic rhinitis    Anxiety    Atrophic vaginitis    Benign head tremor    CKD (chronic kidney disease) stage 3, GFR 30-59 ml/min (HCC)    Dizziness    Elevated serum glutamic pyruvic transaminase (SGPT) level    History of anxiety    History of rheumatic fever    Hypertension    Lung nodule    Macrocytosis    History of chronic macrocytosis   Melanoma (HCC)    Meralgia paresthetica    History of   NSVT (nonsustained ventricular tachycardia) (HCC)    Osteoporosis    Palpitations    occasional   Post-menopausal    Scarlet fever    Sclerosis of the skin    lichen   Splenic artery aneurysm (HCC)    Syncope    Tinnitus    Chronic   Past Surgical History:  Procedure Laterality Date   CATARACT EXTRACTION Bilateral    DENTAL SURGERY     SKIN CANCER EXCISION     TONSILLECTOMY     Patient Active Problem List   Diagnosis Date Noted   Abnormal gait 08/05/2021   Allergic rhinitis 08/05/2021   Benign essential hypertension 08/05/2021   Cardiac arrhythmia 08/05/2021   Chronic anxiety 08/05/2021   Chronic kidney disease, stage 3a ([place]) 08/05/2021   Chronic pain 08/05/2021   Chronic constipation 08/05/2021   Cramp in lower leg associated with rest 08/05/2021   Essential tremor 08/05/2021    Family history of ischemic heart disease (IHD) 08/05/2021   Osteoporosis    Premature atrial complex 01/28/2019   Elevated blood pressure reading in office without diagnosis of hypertension 01/28/2019   History of rheumatic fever 01/28/2019    REFERRING DIAG: M54.50,G89.29 (ICD-10-CM) - Chronic bilateral low back pain without sciatica M25.552 (ICD-10-CM) - Left hip pain  THERAPY DIAG:  Other low back pain  Pain in left hip  Muscle weakness (generalized)  Difficulty in walking, not elsewhere classified  Abnormal posture  Rationale for Evaluation and Treatment Rehabilitation  ONSET DATE: approx. 3 months (May 2023)   SUBJECTIVE:  SUBJECTIVE STATEMENT: Pt indicated feeling like some hip and knee symptoms were doing better.  No specific pain noted today upon arrival.  Pt indicated she tries to stay active at home.    PERTINENT HISTORY:  CKD, Anxiety, HTN, osteoporosis   PAIN:  NPRS scale: no pain upon arrival.  Pain location: back, Lt hip Pain description: achy, fatigue Aggravating factors: transfers into car/bed, lying on Lt/bed mobility Relieving factors: heat/ice, adjusting positions, tylenol occasionally     PRECAUTIONS: None   WEIGHT BEARING RESTRICTIONS No   FALLS:  Has patient fallen in last 6 months? No   LIVING ENVIRONMENT: Lives with: lives with their family 2-3 stairs to enter house with handrail Has following equipment at home: walker use around the house at times    OCCUPATION: Retired   PLOF: Independent, caregiver for husband, self care, light housework/cooking, gardening.  Leaves house usually daily   PATIENT GOALS  Reduce pain, improve walking     OBJECTIVE:                IMAGING REVIEW:             06/09/2022: From MD note:  X-ray images lumbar spine and  left hip obtained today personally and independently interpreted.   Lumbar spine: Diffuse multilevel degenerative changes without acute fractures. Degenerative appearing scoliosis.   Left hip: No acute fractures.  Mild to moderate degenerative changes   PATIENT SURVEYS:    06/09/2022 FOTO intake:  38   predicted:   54   SCREENING FOR RED FLAGS: 06/09/2022 Bowel or bladder incontinence: No Cauda equina syndrome: No     COGNITION: 06/09/2022            Overall cognitive status: Within functional limits for tasks assessed                          SENSATION: 06/09/2022 unremarkable   MUSCLE LENGTH: 06/09/2022 No specific measurements today   POSTURE: 06/09/2022 Increased thoracic kyphosis, Lt lateral trunk lean in sitting/standing, forward head posture, forward trunk lean, reduced lumbar lordosis   PALPATION: 06/09/2022 Tenderness to palpation over Lt glute med muscle   LUMBAR ROM:    Active  AROM  06/09/2022  Flexion WNL  Extension To neutral  Right lateral flexion    Left lateral flexion    Right rotation    Left rotation     (Blank rows = not tested)    LOWER EXTREMITY MMT:     Seated MMT Right 06/09/2022 Left 06/09/2022  Hip flexion 5/5 5/5  Hip extension      Hip abduction 3/5 3-/5  Hip adduction      Hip internal rotation      Hip external rotation      Knee flexion 5/5 5/5  Knee extension 4/5 4/5  Ankle dorsiflexion 5/5 5/5  Ankle plantarflexion      Ankle inversion      Ankle eversion       (Blank rows = not tested)   LUMBAR SPECIAL TESTS:  06/09/2022 No specific testing.    FUNCTIONAL TESTS:  07/13/2022:   TUG : 16.9 seconds c SBA  06/09/2022 Timed up and go (TUG): 26 seconds independently performed     [facility] PT Assessment - 06/09/22 0001                Balance    Balance Assessed Yes          Standardized Balance Assessment  Standardized Balance Assessment Berg Balance Test          Berg Balance Test    Sit to Stand Able to stand   independently using hands     Standing Unsupported Able to stand 2 minutes with supervision     Sitting with Back Unsupported but Feet Supported on Floor or Stool Able to sit safely and securely 2 minutes     Stand to Sit Uses backs of legs against chair to control descent     Transfers Able to transfer safely, definite need of hands     Standing Unsupported with Eyes Closed Able to stand 10 seconds with supervision     Standing Unsupported with Feet Together Able to place feet together independently and stand for 1 minute with supervision     From Standing, Reach Forward with Outstretched Arm Can reach forward >12 cm safely (5")     From Standing Position, Pick up Object from [place] to pick up shoe, needs supervision     From Standing Position, Turn to Look Behind Over each Shoulder Needs supervision when turning     Turn 360 Degrees Needs close supervision or verbal cueing     Standing Unsupported, Alternately Place Feet on Step/Stool Needs assistance to keep from falling or unable to try     Standing Unsupported, One Foot in Front Able to take small step independently and hold 30 seconds     Standing on One Leg Tries to lift leg/unable to hold 3 seconds but remains standing independently     Total Score 32                     TRANSFERS: 06/09/2022                     Sit to/from sidelying: independent with scooting legs and bottom and lifting legs to bed when lying to Lt and ModA to lift LLE to bed when lying to Rt, painful when shifting hip and unweighting Lt hip   GAIT: 07/13/2022:  Ambulation independently c SBA  < 50 ft observed in clinic.  Arrived and left appointment c hand assist from caregiver's arm.   06/23/2022:  Arrived c SPC use in Lt UE primarily within clinic.   06/09/2022 Household distances < 150 ft within clinic, combination of independent ambulation and use of daughter's arm for interlocked support.  Similar ambulation posture as noted in standing posture above.   Widened base of support, minimal hip extension bilateral noted c reduced clear in swing.        TODAY'S TREATMENT  07/13/2022:   Therex  Nustep lvl5 10 mins UE/LE  Seated PF/DF x 10 bilateral (cues for home use)  TherActivity (to improve ambulation, functional mobility ability)  TUG x 2  18 inch chair sit to stand to sit throughout visit no UE assist x 10  Neuro Re-ed  BIG inspired stepping fwd 2 x 10 bilateral c CGA c constant cues for movement pattern improvements  BIG inspired stepping reverse x 10 bilateral c CGA c constant cues for movement pattern improvements  07/07/2022:   Therex  Seated scapular retraction 2 sec hold x 10   Seated clam shell green band x 20 bilateral   Seated rows green 2x10 bilateral  Seated marching with GTB 2 x 1 min  Seated LAQ 2 x 5 bilateral   Sit to stand to sit no UE assist , slow lowering 2 x 5 - 21  inch table height  Nustep lvl5 10 mins UE/LE  07/04/2022:   Therex  Seated scapular retraction 2 sec hold x 10   Seated clam shell green band x 20 bilateral   Seated rows green 2x10 bilateral  Seated SLR 2 x 5 bilateral   Sit to stand to sit no UE assist , slow lowering 2 x 5 - 21 inch table height  Nustep lvl5 7 mins UE/LE  Neuro Re-ed  Standing church pew anterior/posterior weight shift 1 min x 2  Standing modified tandem 30 sec x 1 bilateral  Feet together stance eyes open 1 min, eyes closed 30 seconds    PATIENT EDUCATION:  07/13/2022 Education details: HEP progression Person educated: Patient, daughter Education method: Explanation, Demonstration, Verbal cues, and Handouts Education comprehension: verbalized understanding, returned demonstration, and verbal cues required     HOME EXERCISE PROGRAM: Access Code: [email or web address] URL: [email or web address] Date: 07/13/2022 Prepared by: Scot Jun  Exercises - Seated Gluteal Sets  - 2-3 x daily - 7 x weekly - 10-15 reps - Seated Single Leg Hip Abduction with Resistance   - 2-3 x daily - 7 x weekly - 10-15 reps - Seated Scapular Retraction  - 3-5 x daily - 7 x weekly - 10-15 reps - 3-5sec hold - Sit to Stand  - 1 x daily - 7 x weekly - 1-2 sets - 5-10 reps - Seated Straight Leg Heel Taps  - 1-2 x daily - 7 x weekly - 3 sets - 5 reps - Seated Shoulder Row with Anchored Resistance  - 1-2 x daily - 7 x weekly - 1-2 sets - 10 reps - Seated Toe Raise  - 1-2 x daily - 7 x weekly - 1-2 sets - 10 reps   ASSESSMENT:   CLINICAL IMPRESSION: Noted improvement in TUG performance today compared to previous assessment.  Pt to benefit from continued skilled PT services to improve balance, strength and movement coordination to facilitate progression in independence in mobility c reduced caregiver load.   Positive changes to this point noted.    Additional time spent in clinic today on cues for head up posture for balance improvements, education given on importance of leg strength improvements for stairs, curbs, walking improvements.      OBJECTIVE IMPAIRMENTS Abnormal gait, decreased activity tolerance, decreased balance, decreased coordination, decreased endurance, decreased mobility, difficulty walking, decreased ROM, decreased strength, hypomobility, increased fascial restrictions, impaired perceived functional ability, impaired flexibility, improper body mechanics, postural dysfunction, and pain.    ACTIVITY LIMITATIONS carrying, lifting, bending, standing, squatting, sleeping, stairs, transfers, bed mobility, and locomotion level   PARTICIPATION LIMITATIONS: cleaning, interpersonal relationship, shopping, community activity, and yard work   PERSONAL FACTORS CKD, Anxiety, HTN, osteoporosis are also affecting patient's functional outcome.    REHAB POTENTIAL: Good   CLINICAL DECISION MAKING: Evolving/moderate complexity   EVALUATION COMPLEXITY: Moderate     GOALS: Goals reviewed with patient? Yes   Short term PT Goals (target date for Short term goals are 3 weeks  06/30/2022) Patient will demonstrate independent use of home exercise program to maintain progress from in clinic treatments. Goal status: on going -  assessed 06/23/2022   Long term PT goals (target dates for all long term goals are 10 weeks  08/18/2022 )   1. Patient will demonstrate/report pain at worst less than or equal to 2/10 to facilitate minimal limitation in daily activity secondary to pain symptoms. Goal status: on going - assessed 07/13/2022   2. Patient will demonstrate independent use of  home exercise program to facilitate ability to maintain/progress functional gains from skilled physical therapy services. Goal status: on going - assessed 07/13/2022   3. Patient will demonstrate FOTO outcome > or = 54 % to indicate reduced disability due to condition. Goal status: on going - assessed 07/13/2022   4.  Patient will demonstrate lumbar extension 25 or greater % WFL s symptoms to facilitate upright standing, walking posture at PLOF s limitation. Goal status: on going - assessed 07/13/2022   5.  Patient will demonstrate bilateral knee extension MMT 5/5, hip abduction > or = 4/5 to faciitate transfers, mobility independently without restriction.   Goal status: on going - assessed 07/13/2022   6.  Patient will demonstrate BERG scoring > 45 to indicate reduced fall risk.   Goal status: on going - assessed 07/13/2022   7.  Patient will demonstrate TUG < = 14 seconds to indicate reduced fall risk for mobility improvements.  a.  Goal Status: on going - assessed 07/13/2022     PLAN: PT FREQUENCY: 1-2x/week   PT DURATION: 10 weeks   PLANNED INTERVENTIONS: Therapeutic exercises, Therapeutic activity, Neuro Muscular re-education, Balance training, Gait training, Patient/Family education, Joint mobilization, Stair training, DME instructions, Dry Needling, Electrical stimulation, Cryotherapy, Moist heat, Taping, Ultrasound, Ionotophoresis '4mg'$ /ml Dexamethasone, and Manual therapy.  All included  unless contraindicated.   PLAN FOR NEXT SESSION: Progressive strengthening, balance improvements to improve functional movement patterns.    Scot Jun, PT, DPT, OCS, ATC 07/13/22  12:35 PM

## 2022-07-15 ENCOUNTER — Encounter: Payer: Medicare HMO | Admitting: Rehabilitative and Restorative Service Providers"

## 2022-07-15 DIAGNOSIS — K5909 Other constipation: Secondary | ICD-10-CM | POA: Diagnosis not present

## 2022-07-15 DIAGNOSIS — I10 Essential (primary) hypertension: Secondary | ICD-10-CM | POA: Diagnosis not present

## 2022-07-21 ENCOUNTER — Encounter: Payer: Medicare HMO | Admitting: Rehabilitative and Restorative Service Providers"

## 2022-07-29 ENCOUNTER — Ambulatory Visit: Payer: Medicare HMO | Admitting: Rehabilitative and Restorative Service Providers"

## 2022-07-29 ENCOUNTER — Encounter: Payer: Self-pay | Admitting: Rehabilitative and Restorative Service Providers"

## 2022-07-29 DIAGNOSIS — R262 Difficulty in walking, not elsewhere classified: Secondary | ICD-10-CM

## 2022-07-29 DIAGNOSIS — R293 Abnormal posture: Secondary | ICD-10-CM

## 2022-07-29 DIAGNOSIS — M5459 Other low back pain: Secondary | ICD-10-CM | POA: Diagnosis not present

## 2022-07-29 DIAGNOSIS — M25552 Pain in left hip: Secondary | ICD-10-CM | POA: Diagnosis not present

## 2022-07-29 DIAGNOSIS — M6281 Muscle weakness (generalized): Secondary | ICD-10-CM

## 2022-07-29 NOTE — Therapy (Signed)
OUTPATIENT PHYSICAL THERAPY TREATMENT NOTE   Patient Name: Katie Rubio MRN: [id number] DOB:26-Nov-1928, 88 y.o., female Today's Date: 07/29/2022  PCP: Deland Pretty, MD   REFERRING PROVIDER: Gregor Hams, MD  END OF SESSION:   PT End of Session - 07/29/22 1204     Visit Number 6    Number of Visits 20    Date for PT Re-Evaluation 08/18/22    Authorization Type [employer] $35 copay    Progress Note Due on Visit 10    PT Start Time 1146    PT Stop Time 1231    PT Time Calculation (min) 45 min    Activity Tolerance Patient tolerated treatment well    Behavior During Therapy WFL for tasks assessed/performed             Past Medical History:  Diagnosis Date   Allergic rhinitis    Anxiety    Atrophic vaginitis    Benign head tremor    CKD (chronic kidney disease) stage 3, GFR 30-59 ml/min (HCC)    Dizziness    Elevated serum glutamic pyruvic transaminase (SGPT) level    History of anxiety    History of rheumatic fever    Hypertension    Lung nodule    Macrocytosis    History of chronic macrocytosis   Melanoma (HCC)    Meralgia paresthetica    History of   NSVT (nonsustained ventricular tachycardia) (HCC)    Osteoporosis    Palpitations    occasional   Post-menopausal    Scarlet fever    Sclerosis of the skin    lichen   Splenic artery aneurysm (HCC)    Syncope    Tinnitus    Chronic   Past Surgical History:  Procedure Laterality Date   CATARACT EXTRACTION Bilateral    DENTAL SURGERY     SKIN CANCER EXCISION     TONSILLECTOMY     Patient Active Problem List   Diagnosis Date Noted   Abnormal gait 08/05/2021   Allergic rhinitis 08/05/2021   Benign essential hypertension 08/05/2021   Cardiac arrhythmia 08/05/2021   Chronic anxiety 08/05/2021   Chronic kidney disease, stage 3a ([place]) 08/05/2021   Chronic pain 08/05/2021   Chronic constipation 08/05/2021   Cramp in lower leg associated with rest 08/05/2021   Essential tremor 08/05/2021   Family  history of ischemic heart disease (IHD) 08/05/2021   Osteoporosis    Premature atrial complex 01/28/2019   Elevated blood pressure reading in office without diagnosis of hypertension 01/28/2019   History of rheumatic fever 01/28/2019    REFERRING DIAG: M54.50,G89.29 (ICD-10-CM) - Chronic bilateral low back pain without sciatica M25.552 (ICD-10-CM) - Left hip pain  THERAPY DIAG:  Other low back pain  Pain in left hip  Muscle weakness (generalized)  Difficulty in walking, not elsewhere classified  Abnormal posture  Rationale for Evaluation and Treatment Rehabilitation  ONSET DATE: approx. 3 months (May 2023)   SUBJECTIVE:  SUBJECTIVE STATEMENT: Pt reports GRoC +4 since evaluation, feeling moderately better. She feels that she is generally walking better with intermittent spikes in symptoms due to activities throughout day. She continues doing the exercises at home. She had an increase in pain a few days ago following stooping to clean her floors and new increase in Rt hip pain that limites her walking, bed mobility, and standing the past few days. She is feeling better today following opening her windows lifting them up.   PERTINENT HISTORY:  CKD, Anxiety, HTN, osteoporosis   PAIN:  NPRS scale: 5-6/10 upon arrival Pain location: back, Lt hip Pain description: achy, fatigue Aggravating factors: transfers into car/bed, lying on Lt/bed mobility Relieving factors: heat/ice, adjusting positions, tylenol occasionally     PRECAUTIONS: None   WEIGHT BEARING RESTRICTIONS No   FALLS:  Has patient fallen in last 6 months? No   LIVING ENVIRONMENT: Lives with: lives with their family 2-3 stairs to enter house with handrail Has following equipment at home: walker use around the house at times     OCCUPATION: Retired   PLOF: Independent, caregiver for husband, self care, light housework/cooking, gardening.  Leaves house usually daily   PATIENT GOALS  Reduce pain, improve walking     OBJECTIVE:                IMAGING REVIEW:             06/09/2022: From MD note:  X-ray images lumbar spine and left hip obtained today personally and independently interpreted.   Lumbar spine: Diffuse multilevel degenerative changes without acute fractures. Degenerative appearing scoliosis.   Left hip: No acute fractures.  Mild to moderate degenerative changes   PATIENT SURVEYS:    06/09/2022 FOTO intake:  38   predicted:   54   SCREENING FOR RED FLAGS: 06/09/2022 Bowel or bladder incontinence: No Cauda equina syndrome: No     COGNITION: 06/09/2022            Overall cognitive status: Within functional limits for tasks assessed                          SENSATION: 06/09/2022 unremarkable   MUSCLE LENGTH: 06/09/2022 No specific measurements today   POSTURE: 06/09/2022 Increased thoracic kyphosis, Lt lateral trunk lean in sitting/standing, forward head posture, forward trunk lean, reduced lumbar lordosis   PALPATION: 06/09/2022 Tenderness to palpation over Lt glute med muscle   LUMBAR ROM:    Active  AROM  06/09/2022  Flexion WNL  Extension To neutral  Right lateral flexion    Left lateral flexion    Right rotation    Left rotation     (Blank rows = not tested)    LOWER EXTREMITY MMT:     Seated MMT Right 06/09/2022 Left 06/09/2022  Hip flexion 5/5 5/5  Hip extension      Hip abduction 3/5 3-/5  Hip adduction      Hip internal rotation      Hip external rotation      Knee flexion 5/5 5/5  Knee extension 4/5 4/5  Ankle dorsiflexion 5/5 5/5  Ankle plantarflexion      Ankle inversion      Ankle eversion       (Blank rows = not tested)   LUMBAR SPECIAL TESTS:  06/09/2022 No specific testing.    FUNCTIONAL TESTS:  07/13/2022:   TUG : 16.9 seconds c  SBA  06/09/2022 Timed up and go (  TUG): 26 seconds independently performed     [facility] PT Assessment - 06/09/22 0001                Balance    Balance Assessed Yes          Standardized Balance Assessment    Standardized Balance Assessment Berg Balance Test          Berg Balance Test    Sit to Stand Able to stand  independently using hands     Standing Unsupported Able to stand 2 minutes with supervision     Sitting with Back Unsupported but Feet Supported on Floor or Stool Able to sit safely and securely 2 minutes     Stand to Sit Uses backs of legs against chair to control descent     Transfers Able to transfer safely, definite need of hands     Standing Unsupported with Eyes Closed Able to stand 10 seconds with supervision     Standing Unsupported with Feet Together Able to place feet together independently and stand for 1 minute with supervision     From Standing, Reach Forward with Outstretched Arm Can reach forward >12 cm safely (5")     From Standing Position, Pick up Object from [place] to pick up shoe, needs supervision     From Standing Position, Turn to Look Behind Over each Shoulder Needs supervision when turning     Turn 360 Degrees Needs close supervision or verbal cueing     Standing Unsupported, Alternately Place Feet on Step/Stool Needs assistance to keep from falling or unable to try     Standing Unsupported, One Foot in Front Able to take small step independently and hold 30 seconds     Standing on One Leg Tries to lift leg/unable to hold 3 seconds but remains standing independently     Total Score 32                     TRANSFERS: 07/29/2022:  18 inch chair transfer with UE upon lowering due to pain  06/09/2022                     Sit to/from sidelying: independent with scooting legs and bottom and lifting legs to bed when lying to Lt and ModA to lift LLE to bed when lying to Rt, painful when shifting hip and unweighting Lt hip   GAIT: 07/13/2022:   Ambulation independently c SBA  < 50 ft observed in clinic.  Arrived and left appointment c hand assist from caregiver's arm.   06/23/2022:  Arrived c SPC use in Lt UE primarily within clinic.   06/09/2022 Household distances < 150 ft within clinic, combination of independent ambulation and use of daughter's arm for interlocked support.  Similar ambulation posture as noted in standing posture above.  Widened base of support, minimal hip extension bilateral noted c reduced clear in swing.       TODAY'S TREATMENT  07/29/2022: Therex  Nustep lvl5 10 mins UE/LE  Seated clamshell green band alternating LE movement x 15 ea  Neuro Re-ed:  STS with alternating marching from chair with foam pad x 6  Standing looking over shoulder x 5 bil. SBA with visual cues for increased turn, no reported vertigo  Standing alternating UE reaching at shoulder height with mild to moderate reach beyond BOS and SBA  07/13/2022:   Therex  Nustep lvl5 10 mins UE/LE  Seated PF/DF x 10 bilateral (cues  for home use)  TherActivity (to improve ambulation, functional mobility ability)  TUG x 2  18 inch chair sit to stand to sit throughout visit no UE assist x 10  Neuro Re-ed  BIG inspired stepping fwd 2 x 10 bilateral c CGA c constant cues for movement pattern improvements  BIG inspired stepping reverse x 10 bilateral c CGA c constant cues for movement pattern improvements  07/07/2022:   Therex  Seated scapular retraction 2 sec hold x 10   Seated clam shell green band x 20 bilateral   Seated rows green 2x10 bilateral  Seated marching with GTB 2 x 1 min  Seated LAQ 2 x 5 bilateral   Sit to stand to sit no UE assist , slow lowering 2 x 5 - 21 inch table height  Nustep lvl5 10 mins UE/LE   PATIENT EDUCATION:  07/13/2022 Education details: HEP progression Person educated: Patient, daughter Education method: Explanation, Demonstration, Verbal cues, and Handouts Education comprehension: verbalized understanding,  returned demonstration, and verbal cues required     HOME EXERCISE PROGRAM: Access Code: [email or web address] URL: [email or web address] Date: 07/13/2022 Prepared by: Scot Jun  Exercises - Seated Gluteal Sets  - 2-3 x daily - 7 x weekly - 10-15 reps - Seated Single Leg Hip Abduction with Resistance  - 2-3 x daily - 7 x weekly - 10-15 reps - Seated Scapular Retraction  - 3-5 x daily - 7 x weekly - 10-15 reps - 3-5sec hold - Sit to Stand  - 1 x daily - 7 x weekly - 1-2 sets - 5-10 reps - Seated Straight Leg Heel Taps  - 1-2 x daily - 7 x weekly - 3 sets - 5 reps - Seated Shoulder Row with Anchored Resistance  - 1-2 x daily - 7 x weekly - 1-2 sets - 10 reps - Seated Toe Raise  - 1-2 x daily - 7 x weekly - 1-2 sets - 10 reps   ASSESSMENT:   CLINICAL IMPRESSION: She presents today with increased symptoms following an increase in activity a few days ago, but in general reports a GRoC +4 since evaluation. Discussion of POC included continued emphasis on HEP for strengthening with focus on balance and progressive strengthening in clinic, and pt and daughter verbalized understanding. She will continue to benefit from skilled PT services to address balance, strength, and ambulation to improve functional mobility and safety.   OBJECTIVE IMPAIRMENTS Abnormal gait, decreased activity tolerance, decreased balance, decreased coordination, decreased endurance, decreased mobility, difficulty walking, decreased ROM, decreased strength, hypomobility, increased fascial restrictions, impaired perceived functional ability, impaired flexibility, improper body mechanics, postural dysfunction, and pain.    ACTIVITY LIMITATIONS carrying, lifting, bending, standing, squatting, sleeping, stairs, transfers, bed mobility, and locomotion level   PARTICIPATION LIMITATIONS: cleaning, interpersonal relationship, shopping, community activity, and yard work   PERSONAL FACTORS CKD, Anxiety, HTN, osteoporosis are  also affecting patient's functional outcome.    REHAB POTENTIAL: Good   CLINICAL DECISION MAKING: Evolving/moderate complexity   EVALUATION COMPLEXITY: Moderate     GOALS: Goals reviewed with patient? Yes   Short term PT Goals (target date for Short term goals are 3 weeks 06/30/2022) Patient will demonstrate independent use of home exercise program to maintain progress from in clinic treatments. Goal status: on going -  assessed 06/23/2022   Long term PT goals (target dates for all long term goals are 10 weeks  08/18/2022 )   1. Patient will demonstrate/report pain at worst less than or equal to 2/10 to facilitate minimal  limitation in daily activity secondary to pain symptoms. Goal status: on going - assessed 07/13/2022   2. Patient will demonstrate independent use of home exercise program to facilitate ability to maintain/progress functional gains from skilled physical therapy services. Goal status: on going - assessed 07/13/2022   3. Patient will demonstrate FOTO outcome > or = 54 % to indicate reduced disability due to condition. Goal status: on going - assessed 07/13/2022   4.  Patient will demonstrate lumbar extension 25 or greater % WFL s symptoms to facilitate upright standing, walking posture at PLOF s limitation. Goal status: on going - assessed 07/13/2022   5.  Patient will demonstrate bilateral knee extension MMT 5/5, hip abduction > or = 4/5 to faciitate transfers, mobility independently without restriction.   Goal status: on going - assessed 07/13/2022   6.  Patient will demonstrate BERG scoring > 45 to indicate reduced fall risk.   Goal status: on going - assessed 07/13/2022   7.  Patient will demonstrate TUG < = 14 seconds to indicate reduced fall risk for mobility improvements.  a.  Goal Status: on going - assessed 07/13/2022     PLAN: PT FREQUENCY: 1-2x/week   PT DURATION: 10 weeks   PLANNED INTERVENTIONS: Therapeutic exercises, Therapeutic activity, Neuro  Muscular re-education, Balance training, Gait training, Patient/Family education, Joint mobilization, Stair training, DME instructions, Dry Needling, Electrical stimulation, Cryotherapy, Moist heat, Taping, Ultrasound, Ionotophoresis '4mg'$ /ml Dexamethasone, and Manual therapy.  All included unless contraindicated.   PLAN FOR NEXT SESSION: Assess Berg and strength. Continue progressive strengthening, dynamic balance tasks to facilitate functional movements and household mobility   Jana Hakim, Student-PT 07/29/2022, 12:55 PM  This entire session of physical therapy was performed under the direct supervision of PT signing evaluation /treatment. PT reviewed note and agrees.  Scot Jun, PT, DPT, OCS, ATC 07/29/22  12:55 PM

## 2022-08-04 NOTE — Progress Notes (Signed)
88 y.o. [id number] Married White or Caucasian Not Hispanic or Latino female here for annual exam.  No vaginal bleeding.   Complains of vaginal and perianal itching.  She is taking metamucil, having a BM most days, has to strain.  She leaks a little bit of urine on the way to the bathroom, mostly at night. Worse in the last year.   Nocturia x 3.  H/O lichen sclerosis. She has steroid ointment, doesn't use it often.   H/O osteoporosis, on Prolia, managed by primary  Husband has dementia, they live independently, kids help out a lot.     Patient's last menstrual period was 11/14/1978 (approximate).          Sexually active: No.  The current method of family planning is tubal ligation and post menopausal status.    Exercising: Yes.    Physical therapy, walking,  Smoker:  no  Health Maintenance: Pap:   04-06-15 Neg, 03-23-11 Neg  History of abnormal Pap:  no MMG:  09/2008 3D/Density B/Neg/BiRads1, discussed screening BMD:   4/19  Colonoscopy: 01/2011 polyps;  PCP states no longer needed  TDaP:  2015 Gardasil: na   reports that she quit smoking about 55 years ago. Her smoking use included cigarettes. She has a 1.25 pack-year smoking history. She has never used smokeless tobacco. She reports that she does not drink alcohol and does not use drugs. Had 5 children, son died at 43. Has 4 living children, 8 grand children and 7 great grand children (one on the way).  Past Medical History:  Diagnosis Date   Allergic rhinitis    Anxiety    Atrophic vaginitis    Benign head tremor    CKD (chronic kidney disease) stage 3, GFR 30-59 ml/min (HCC)    Dizziness    Elevated serum glutamic pyruvic transaminase (SGPT) level    History of anxiety    History of rheumatic fever    Hypertension    Lung nodule    Macrocytosis    History of chronic macrocytosis   Melanoma (HCC)    Meralgia paresthetica    History of   NSVT (nonsustained ventricular tachycardia) (HCC)    Osteoporosis    Palpitations     occasional   Post-menopausal    Scarlet fever    Sclerosis of the skin    lichen   Splenic artery aneurysm (HCC)    Syncope    Tinnitus    Chronic    Past Surgical History:  Procedure Laterality Date   CATARACT EXTRACTION Bilateral    DENTAL SURGERY     SKIN CANCER EXCISION     TONSILLECTOMY      Current Outpatient Medications  Medication Sig Dispense Refill   Cholecalciferol (VITAMIN D) 2000 UNITS CAPS Take 2,000 Units by mouth every other day.      clobetasol ointment (TEMOVATE) 0.05 % Apply a small amount topically BID for up to 2 weeks prn. Can use the ointment 2 x a week baseline as needed. Not for daily long term use. 60 g 1   Coenzyme Q10 (CO Q 10 PO) Take by mouth every other day.     Cyanocobalamin (VITAMIN B 12 PO) Take 1 tablet by mouth daily.     denosumab (PROLIA) 60 MG/ML SOSY injection 60 mg     Flaxseed, Linseed, (FLAXSEED OIL) 1000 MG CAPS Take by mouth daily.     Grape Seed 50 MG TABS as needed.     magnesium 30 MG tablet Take 30 mg  by mouth daily.     metoprolol tartrate (LOPRESSOR) 25 MG tablet TAKE (1/2) TABLET TWICE DAILY. 60 tablet 0   Omega-3 Fatty Acids (FISH OIL PO) Take by mouth daily.      polyethylene glycol (MIRALAX) 17 g packet Take 17 g by mouth daily. 14 each 0   Tragacanth (ASTRAGALUS ROOT) POWD 1     Turmeric 500 MG CAPS Take by mouth.     UNABLE TO FIND astragulus (Patient not taking: Reported on 04/07/2022)     Valerian Root 100 MG CAPS as needed.     No current facility-administered medications for this visit.    Family History  Problem Relation Age of Onset   Heart attack Father    Cancer - Colon Mother 47   Hypertension Mother    Heart disease Sister    Cancer Son        muscle    Review of Systems  All other systems reviewed and are negative.   Exam:   LMP 11/14/1978 (Approximate)   Weight change: '@WEIGHTCHANGE'$ @ Height:      Ht Readings from Last 3 Encounters:  05/26/22 '5\' 2"'$  (1.575 m)  04/07/22 '5\' 2"'$  (1.575 m)   03/12/21 4' 11.5" (1.511 m)    General appearance: alert, cooperative and appears stated age Head: Normocephalic, without obvious abnormality, atraumatic Neck: no adenopathy, supple, symmetrical, trachea midline and thyroid normal to inspection and palpation Breasts: normal appearance, no masses or tenderness Abdomen: soft, non-tender; non distended,  no masses,  no organomegaly Extremities: extremities normal, atraumatic, no cyanosis or edema Skin: Skin color, texture, turgor normal. No rashes or lesions Lymph nodes: Cervical, supraclavicular, and axillary nodes normal. No abnormal inguinal nodes palpated Neurologic: Grossly normal   Pelvic: External genitalia:  no lesions, mild whitening and agglutination around the clitoris, mild whitening on the perineum and in the perianal region. No lesions, no fissures              Urethra:  normal appearing urethra with no masses, tenderness or lesions              Bartholins and Skenes: normal                 Vagina: atrophic appearing vagina with normal color and discharge, no lesions              Cervix: no lesions               Bimanual Exam:  Uterus:  normal size, contour, position, consistency, mobility, non-tender              Adnexa: no mass, fullness, tenderness               Rectovaginal: Confirms               Anus:  normal sphincter tone, no lesions  Gae Dry chaperoned for the exam.  1. Encounter for breast and pelvic examination No longer doing screening testing  2. Lichen sclerosus Discussed vulvar skin care - clobetasol ointment (TEMOVATE) 0.05 %; Apply a small amount topically BID for up to 2 weeks prn. Can use the ointment 2 x a week baseline as needed. Not for daily long term use.  Dispense: 60 g; Refill: 1  3. Urge incontinence of urine - Urinalysis, Complete  4. Chronic vulvitis - WET PREP FOR TRICH, YEAST, CLUE - clobetasol ointment (TEMOVATE) 0.05 %; Apply a small amount topically BID for up to 2 weeks prn.  Can use the  ointment 2 x a week baseline as needed. Not for daily long term use.  Dispense: 60 g; Refill: 1  5. Perianal dermatitis Discussed skin care - clobetasol ointment (TEMOVATE) 0.05 %; Apply a small amount topically BID for up to 2 weeks prn. Can use the ointment 2 x a week baseline as needed. Not for daily long term use.  Dispense: 60 g; Refill: 1

## 2022-08-04 NOTE — Therapy (Signed)
OUTPATIENT PHYSICAL THERAPY TREATMENT NOTE   Patient Name: Katie Rubio MRN: [id number] DOB:[birth year], 88 y.o., female Today's Date: 08/05/2022  PCP: Deland Pretty, MD   REFERRING PROVIDER: Gregor Hams, MD  END OF SESSION:   PT End of Session - 08/05/22 1153     Visit Number 7    Number of Visits 20    Date for PT Re-Evaluation 08/18/22    Authorization Type [employer] $35 copay    Progress Note Due on Visit 10    PT Start Time 1150    PT Stop Time 1230    PT Time Calculation (min) 40 min              Past Medical History:  Diagnosis Date   Allergic rhinitis    Anxiety    Atrophic vaginitis    Benign head tremor    CKD (chronic kidney disease) stage 3, GFR 30-59 ml/min (HCC)    Dizziness    Elevated serum glutamic pyruvic transaminase (SGPT) level    History of anxiety    History of rheumatic fever    Hypertension    Lung nodule    Macrocytosis    History of chronic macrocytosis   Melanoma (HCC)    Meralgia paresthetica    History of   NSVT (nonsustained ventricular tachycardia) (HCC)    Osteoporosis    Palpitations    occasional   Post-menopausal    Scarlet fever    Sclerosis of the skin    lichen   Splenic artery aneurysm (HCC)    Syncope    Tinnitus    Chronic   Past Surgical History:  Procedure Laterality Date   CATARACT EXTRACTION Bilateral    DENTAL SURGERY     SKIN CANCER EXCISION     TONSILLECTOMY     Patient Active Problem List   Diagnosis Date Noted   Abnormal gait 08/05/2021   Allergic rhinitis 08/05/2021   Benign essential hypertension 08/05/2021   Cardiac arrhythmia 08/05/2021   Chronic anxiety 08/05/2021   Chronic kidney disease, stage 3a ([place]) 08/05/2021   Chronic pain 08/05/2021   Chronic constipation 08/05/2021   Cramp in lower leg associated with rest 08/05/2021   Essential tremor 08/05/2021   Family history of ischemic heart disease (IHD) 08/05/2021   Osteoporosis    Premature atrial complex 01/28/2019    Elevated blood pressure reading in office without diagnosis of hypertension 01/28/2019   History of rheumatic fever 01/28/2019    REFERRING DIAG: M54.50,G89.29 (ICD-10-CM) - Chronic bilateral low back pain without sciatica M25.552 (ICD-10-CM) - Left hip pain  THERAPY DIAG:  Other low back pain  Pain in left hip  Muscle weakness (generalized)  Difficulty in walking, not elsewhere classified  Abnormal posture  Rationale for Evaluation and Treatment Rehabilitation  ONSET DATE: approx. 3 months (May 2023)   SUBJECTIVE:  SUBJECTIVE STATEMENT: Pt states that she was more active yesterday due to family coming into town. Pt reports increased lower back pain when going to bed.    PERTINENT HISTORY:  CKD, Anxiety, HTN, osteoporosis   PAIN:  NPRS scale: 5-6/10 upon arrival Pain location: back, Lt hip Pain description: achy, fatigue Aggravating factors: transfers into car/bed, lying on Lt/bed mobility Relieving factors: heat/ice, adjusting positions, tylenol occasionally     PRECAUTIONS: None   WEIGHT BEARING RESTRICTIONS No   FALLS:  Has patient fallen in last 6 months? No   LIVING ENVIRONMENT: Lives with: lives with their family 2-3 stairs to enter house with handrail Has following equipment at home: walker use around the house at times    OCCUPATION: Retired   PLOF: Independent, caregiver for husband, self care, light housework/cooking, gardening.  Leaves house usually daily   PATIENT GOALS  Reduce pain, improve walking     OBJECTIVE:                IMAGING REVIEW:             06/09/2022: From MD note:  X-ray images lumbar spine and left hip obtained today personally and independently interpreted.   Lumbar spine: Diffuse multilevel degenerative changes without acute fractures.  Degenerative appearing scoliosis.   Left hip: No acute fractures.  Mild to moderate degenerative changes   PATIENT SURVEYS:    06/09/2022 FOTO intake:  38   predicted:   54   SCREENING FOR RED FLAGS: 06/09/2022 Bowel or bladder incontinence: No Cauda equina syndrome: No     COGNITION: 06/09/2022            Overall cognitive status: Within functional limits for tasks assessed                          SENSATION: 06/09/2022 unremarkable   MUSCLE LENGTH: 06/09/2022 No specific measurements today   POSTURE: 06/09/2022 Increased thoracic kyphosis, Lt lateral trunk lean in sitting/standing, forward head posture, forward trunk lean, reduced lumbar lordosis   PALPATION: 06/09/2022 Tenderness to palpation over Lt glute med muscle   LUMBAR ROM:    Active  AROM  06/09/2022  Flexion WNL  Extension To neutral  Right lateral flexion    Left lateral flexion    Right rotation    Left rotation     (Blank rows = not tested)    LOWER EXTREMITY MMT:     Seated MMT Right 06/09/2022 Left 06/09/2022  Hip flexion 5/5 5/5  Hip extension      Hip abduction 3/5 3-/5  Hip adduction      Hip internal rotation      Hip external rotation      Knee flexion 5/5 5/5  Knee extension 4/5 4/5  Ankle dorsiflexion 5/5 5/5  Ankle plantarflexion      Ankle inversion      Ankle eversion       (Blank rows = not tested)   LUMBAR SPECIAL TESTS:  06/09/2022 No specific testing.    FUNCTIONAL TESTS:  07/13/2022:   TUG : 16.9 seconds c SBA  06/09/2022 Timed up and go (TUG): 26 seconds independently performed     [facility] PT Assessment - 06/09/22 0001                Balance    Balance Assessed Yes          Standardized Balance Assessment    Standardized Balance Assessment Berg Balance  Test          Berg Balance Test    Sit to Stand Able to stand  independently using hands     Standing Unsupported Able to stand 2 minutes with supervision     Sitting with Back Unsupported but Feet Supported on  Floor or Stool Able to sit safely and securely 2 minutes     Stand to Sit Uses backs of legs against chair to control descent     Transfers Able to transfer safely, definite need of hands     Standing Unsupported with Eyes Closed Able to stand 10 seconds with supervision     Standing Unsupported with Feet Together Able to place feet together independently and stand for 1 minute with supervision     From Standing, Reach Forward with Outstretched Arm Can reach forward >12 cm safely (5")     From Standing Position, Pick up Object from [place] to pick up shoe, needs supervision     From Standing Position, Turn to Look Behind Over each Shoulder Needs supervision when turning     Turn 360 Degrees Needs close supervision or verbal cueing     Standing Unsupported, Alternately Place Feet on Step/Stool Needs assistance to keep from falling or unable to try     Standing Unsupported, One Foot in Front Able to take small step independently and hold 30 seconds     Standing on One Leg Tries to lift leg/unable to hold 3 seconds but remains standing independently     Total Score 32                     TRANSFERS: 07/29/2022:  18 inch chair transfer with UE upon lowering due to pain  06/09/2022                     Sit to/from sidelying: independent with scooting legs and bottom and lifting legs to bed when lying to Lt and ModA to lift LLE to bed when lying to Rt, painful when shifting hip and unweighting Lt hip   GAIT: 07/13/2022:  Ambulation independently c SBA  < 50 ft observed in clinic.  Arrived and left appointment c hand assist from caregiver's arm.   06/23/2022:  Arrived c SPC use in Lt UE primarily within clinic.   06/09/2022 Household distances < 150 ft within clinic, combination of independent ambulation and use of daughter's arm for interlocked support.  Similar ambulation posture as noted in standing posture above.  Widened base of support, minimal hip extension bilateral noted c reduced  clear in swing.       TODAY'S TREATMENT  08/05/2022: Nustep 5 min, level 5 Bed mobility  Gait training with head turns 2 laps of clinic ~150f , CGA Stepping over objects 4 laps, CGA  07/29/2022: Therex  Nustep lvl5 10 mins UE/LE  Seated clamshell green band alternating LE movement x 15 ea  Neuro Re-ed:  STS with alternating marching from chair with foam pad x 6  Standing looking over shoulder x 5 bil. SBA with visual cues for increased turn, no reported vertigo  Standing alternating UE reaching at shoulder height with mild to moderate reach beyond BOS and SBA  07/13/2022:   Therex  Nustep lvl5 10 mins UE/LE  Seated PF/DF x 10 bilateral (cues for home use)  TherActivity (to improve ambulation, functional mobility ability)  TUG x 2  18 inch chair sit to stand to sit throughout visit no UE  assist x 10  Neuro Re-ed  BIG inspired stepping fwd 2 x 10 bilateral c CGA c constant cues for movement pattern improvements  BIG inspired stepping reverse x 10 bilateral c CGA c constant cues for movement pattern improvements  07/07/2022:   Therex  Seated scapular retraction 2 sec hold x 10   Seated clam shell green band x 20 bilateral   Seated rows green 2x10 bilateral  Seated marching with GTB 2 x 1 min  Seated LAQ 2 x 5 bilateral   Sit to stand to sit no UE assist , slow lowering 2 x 5 - 21 inch table height  Nustep lvl5 10 mins UE/LE   PATIENT EDUCATION:  07/13/2022 Education details: HEP progression Person educated: Patient, daughter Education method: Explanation, Demonstration, Verbal cues, and Handouts Education comprehension: verbalized understanding, returned demonstration, and verbal cues required     HOME EXERCISE PROGRAM: Access Code: [email or web address] URL: [email or web address] Date: 07/13/2022 Prepared by: Scot Jun  Exercises - Seated Gluteal Sets  - 2-3 x daily - 7 x weekly - 10-15 reps - Seated Single Leg Hip Abduction with Resistance  - 2-3 x  daily - 7 x weekly - 10-15 reps - Seated Scapular Retraction  - 3-5 x daily - 7 x weekly - 10-15 reps - 3-5sec hold - Sit to Stand  - 1 x daily - 7 x weekly - 1-2 sets - 5-10 reps - Seated Straight Leg Heel Taps  - 1-2 x daily - 7 x weekly - 3 sets - 5 reps - Seated Shoulder Row with Anchored Resistance  - 1-2 x daily - 7 x weekly - 1-2 sets - 10 reps - Seated Toe Raise  - 1-2 x daily - 7 x weekly - 1-2 sets - 10 reps   ASSESSMENT:   CLINICAL IMPRESSION: She presents today with increased symptoms following an increase in activity yesterday. Today's session with focus on bed mobility due to pt and daughter in law stating pt having pain and difficulty. Pt is very self limiting and is reluctant to lay on her back due to past history of vertigo 5 years ago. Pt encouraged to lay on her back to perform a bridge to help with bed mobility with pt denying. Pt also reporting difficulty with gait with stepping over objects and head turns, so followed up with CGA and gait exercises. Pt requires a lot of verbal encouragement throughout session and relies heavily on caregiver present with her throughout session. She will continue to benefit from skilled PT services to address balance, strength, and ambulation to improve functional mobility and safety.   OBJECTIVE IMPAIRMENTS Abnormal gait, decreased activity tolerance, decreased balance, decreased coordination, decreased endurance, decreased mobility, difficulty walking, decreased ROM, decreased strength, hypomobility, increased fascial restrictions, impaired perceived functional ability, impaired flexibility, improper body mechanics, postural dysfunction, and pain.    ACTIVITY LIMITATIONS carrying, lifting, bending, standing, squatting, sleeping, stairs, transfers, bed mobility, and locomotion level   PARTICIPATION LIMITATIONS: cleaning, interpersonal relationship, shopping, community activity, and yard work   PERSONAL FACTORS CKD, Anxiety, HTN, osteoporosis  are also affecting patient's functional outcome.    REHAB POTENTIAL: Good   CLINICAL DECISION MAKING: Evolving/moderate complexity   EVALUATION COMPLEXITY: Moderate     GOALS: Goals reviewed with patient? Yes   Short term PT Goals (target date for Short term goals are 3 weeks 06/30/2022) Patient will demonstrate independent use of home exercise program to maintain progress from in clinic treatments. Goal status: on going -  assessed 06/23/2022  Long term PT goals (target dates for all long term goals are 10 weeks  08/18/2022 )   1. Patient will demonstrate/report pain at worst less than or equal to 2/10 to facilitate minimal limitation in daily activity secondary to pain symptoms. Goal status: on going - assessed 07/13/2022   2. Patient will demonstrate independent use of home exercise program to facilitate ability to maintain/progress functional gains from skilled physical therapy services. Goal status: on going - assessed 07/13/2022   3. Patient will demonstrate FOTO outcome > or = 54 % to indicate reduced disability due to condition. Goal status: on going - assessed 07/13/2022   4.  Patient will demonstrate lumbar extension 25 or greater % WFL s symptoms to facilitate upright standing, walking posture at PLOF s limitation. Goal status: on going - assessed 07/13/2022   5.  Patient will demonstrate bilateral knee extension MMT 5/5, hip abduction > or = 4/5 to faciitate transfers, mobility independently without restriction.   Goal status: on going - assessed 07/13/2022   6.  Patient will demonstrate BERG scoring > 45 to indicate reduced fall risk.   Goal status: on going - assessed 07/13/2022   7.  Patient will demonstrate TUG < = 14 seconds to indicate reduced fall risk for mobility improvements.  a.  Goal Status: on going - assessed 07/13/2022     PLAN: PT FREQUENCY: 1-2x/week   PT DURATION: 10 weeks   PLANNED INTERVENTIONS: Therapeutic exercises, Therapeutic activity, Neuro  Muscular re-education, Balance training, Gait training, Patient/Family education, Joint mobilization, Stair training, DME instructions, Dry Needling, Electrical stimulation, Cryotherapy, Moist heat, Taping, Ultrasound, Ionotophoresis '4mg'$ /ml Dexamethasone, and Manual therapy.  All included unless contraindicated.   PLAN FOR NEXT SESSION: Assess Berg and strength. Continue progressive strengthening, dynamic balance tasks to facilitate functional movements and household mobility   Lynden Ang, PT 08/05/2022, 12:41 PM

## 2022-08-05 ENCOUNTER — Ambulatory Visit: Payer: Medicare HMO | Admitting: Physical Therapy

## 2022-08-05 DIAGNOSIS — R262 Difficulty in walking, not elsewhere classified: Secondary | ICD-10-CM

## 2022-08-05 DIAGNOSIS — M6281 Muscle weakness (generalized): Secondary | ICD-10-CM

## 2022-08-05 DIAGNOSIS — M5459 Other low back pain: Secondary | ICD-10-CM | POA: Diagnosis not present

## 2022-08-05 DIAGNOSIS — M25552 Pain in left hip: Secondary | ICD-10-CM

## 2022-08-05 DIAGNOSIS — R293 Abnormal posture: Secondary | ICD-10-CM

## 2022-08-08 DIAGNOSIS — M810 Age-related osteoporosis without current pathological fracture: Secondary | ICD-10-CM | POA: Diagnosis not present

## 2022-08-08 DIAGNOSIS — I10 Essential (primary) hypertension: Secondary | ICD-10-CM | POA: Diagnosis not present

## 2022-08-11 ENCOUNTER — Encounter: Payer: Self-pay | Admitting: Obstetrics and Gynecology

## 2022-08-11 ENCOUNTER — Ambulatory Visit (INDEPENDENT_AMBULATORY_CARE_PROVIDER_SITE_OTHER): Payer: Medicare HMO | Admitting: Obstetrics and Gynecology

## 2022-08-11 VITALS — BP 150/80 | Ht 60.0 in | Wt 116.0 lb

## 2022-08-11 DIAGNOSIS — N763 Subacute and chronic vulvitis: Secondary | ICD-10-CM

## 2022-08-11 DIAGNOSIS — L309 Dermatitis, unspecified: Secondary | ICD-10-CM | POA: Diagnosis not present

## 2022-08-11 DIAGNOSIS — Z01419 Encounter for gynecological examination (general) (routine) without abnormal findings: Secondary | ICD-10-CM

## 2022-08-11 DIAGNOSIS — N3941 Urge incontinence: Secondary | ICD-10-CM | POA: Diagnosis not present

## 2022-08-11 DIAGNOSIS — L900 Lichen sclerosus et atrophicus: Secondary | ICD-10-CM | POA: Diagnosis not present

## 2022-08-11 LAB — URINALYSIS, COMPLETE
Bilirubin Urine: NEGATIVE
Casts: NONE SEEN /LPF
Crystals: NONE SEEN /HPF
Glucose, UA: NEGATIVE
Hyaline Cast: NONE SEEN /LPF
Ketones, ur: NEGATIVE
Leukocytes,Ua: NEGATIVE
Nitrite: NEGATIVE
Specific Gravity, Urine: 1.02 (ref 1.001–1.035)
Yeast: NONE SEEN /HPF
pH: 5.5 (ref 5.0–8.0)

## 2022-08-11 LAB — WET PREP FOR TRICH, YEAST, CLUE

## 2022-08-11 MED ORDER — CLOBETASOL PROPIONATE 0.05 % EX OINT: TOPICAL_OINTMENT | CUTANEOUS | 1 refills | Status: AC

## 2022-08-11 NOTE — Addendum Note (Signed)
Addended by: Dorothy Spark on: 08/11/2022 03:35 PM   Modules accepted: Orders

## 2022-08-12 ENCOUNTER — Encounter: Payer: Medicare HMO | Admitting: Physical Therapy

## 2022-08-18 ENCOUNTER — Encounter: Payer: Medicare HMO | Admitting: Rehabilitative and Restorative Service Providers"

## 2022-08-18 ENCOUNTER — Other Ambulatory Visit: Payer: Self-pay

## 2022-08-18 DIAGNOSIS — R3129 Other microscopic hematuria: Secondary | ICD-10-CM

## 2022-08-19 ENCOUNTER — Other Ambulatory Visit: Payer: Medicare HMO

## 2022-08-19 DIAGNOSIS — L603 Nail dystrophy: Secondary | ICD-10-CM | POA: Diagnosis not present

## 2022-08-19 DIAGNOSIS — I739 Peripheral vascular disease, unspecified: Secondary | ICD-10-CM | POA: Diagnosis not present

## 2022-08-19 DIAGNOSIS — R3129 Other microscopic hematuria: Secondary | ICD-10-CM | POA: Diagnosis not present

## 2022-08-19 DIAGNOSIS — L84 Corns and callosities: Secondary | ICD-10-CM | POA: Diagnosis not present

## 2022-08-22 ENCOUNTER — Other Ambulatory Visit: Payer: Self-pay

## 2022-08-22 MED ORDER — AMOXICILLIN 500 MG PO CAPS: 500.0000 mg | ORAL_CAPSULE | Freq: Three times a day (TID) | ORAL | 0 refills | Status: DC

## 2022-08-22 NOTE — Therapy (Signed)
OUTPATIENT PHYSICAL THERAPY TREATMENT NOTE   Patient Name: Katie Rubio MRN: [id number] DOB:December 26, 1928, 88 y.o., female Today's Date: 08/25/2022  PCP: Deland Pretty, MD   REFERRING PROVIDER: Gregor Hams, MD  END OF SESSION:   PT End of Session - 08/25/22 1522     Visit Number 8    Number of Visits 20    Date for PT Re-Evaluation 08/18/22    Authorization Type [employer] $35 copay    Progress Note Due on Visit 10    PT Start Time 1519    PT Stop Time 1555    PT Time Calculation (min) 36 min    Activity Tolerance Patient tolerated treatment well    Behavior During Therapy WFL for tasks assessed/performed               Past Medical History:  Diagnosis Date   Allergic rhinitis    Anxiety    Atrophic vaginitis    Benign head tremor    CKD (chronic kidney disease) stage 3, GFR 30-59 ml/min (HCC)    Dizziness    Elevated serum glutamic pyruvic transaminase (SGPT) level    History of anxiety    History of rheumatic fever    Hypertension    Lung nodule    Macrocytosis    History of chronic macrocytosis   Melanoma (HCC)    Meralgia paresthetica    History of   NSVT (nonsustained ventricular tachycardia) (HCC)    Osteoporosis    Palpitations    occasional   Post-menopausal    Scarlet fever    Sclerosis of the skin    lichen   Splenic artery aneurysm (HCC)    Syncope    Tinnitus    Chronic   Past Surgical History:  Procedure Laterality Date   CATARACT EXTRACTION Bilateral    DENTAL SURGERY     SKIN CANCER EXCISION     TONSILLECTOMY     Patient Active Problem List   Diagnosis Date Noted   Abnormal gait 08/05/2021   Allergic rhinitis 08/05/2021   Benign essential hypertension 08/05/2021   Cardiac arrhythmia 08/05/2021   Chronic anxiety 08/05/2021   Chronic kidney disease, stage 3a ([place]) 08/05/2021   Chronic pain 08/05/2021   Chronic constipation 08/05/2021   Cramp in lower leg associated with rest 08/05/2021   Essential tremor 08/05/2021    Family history of ischemic heart disease (IHD) 08/05/2021   Osteoporosis    Premature atrial complex 01/28/2019   Elevated blood pressure reading in office without diagnosis of hypertension 01/28/2019   History of rheumatic fever 01/28/2019    REFERRING DIAG: M54.50,G89.29 (ICD-10-CM) - Chronic bilateral low back pain without sciatica M25.552 (ICD-10-CM) - Left hip pain  THERAPY DIAG:  Other low back pain  Pain in left hip  Muscle weakness (generalized)  Difficulty in walking, not elsewhere classified  Abnormal posture  Abnormal gait  Rationale for Evaluation and Treatment Rehabilitation  ONSET DATE: approx. 3 months (May 2023)   SUBJECTIVE:  SUBJECTIVE STATEMENT: Pt states that she has been feeling better but recently had a set back after doing some more work around the house.    PERTINENT HISTORY:  CKD, Anxiety, HTN, osteoporosis   PAIN:  NPRS scale: 5-6/10 upon arrival Pain location: back, Lt hip Pain description: achy, fatigue Aggravating factors: transfers into car/bed, lying on Lt/bed mobility Relieving factors: heat/ice, adjusting positions, tylenol occasionally     PRECAUTIONS: None   WEIGHT BEARING RESTRICTIONS No   FALLS:  Has patient fallen in last 6 months? No   LIVING ENVIRONMENT: Lives with: lives with their family 2-3 stairs to enter house with handrail Has following equipment at home: walker use around the house at times    OCCUPATION: Retired   PLOF: Independent, caregiver for husband, self care, light housework/cooking, gardening.  Leaves house usually daily   PATIENT GOALS  Reduce pain, improve walking     OBJECTIVE:                IMAGING REVIEW:             06/09/2022: From MD note:  X-ray images lumbar spine and left hip obtained today personally  and independently interpreted.   Lumbar spine: Diffuse multilevel degenerative changes without acute fractures. Degenerative appearing scoliosis.   Left hip: No acute fractures.  Mild to moderate degenerative changes   PATIENT SURVEYS:    06/09/2022 FOTO intake:  38   predicted:   54   SCREENING FOR RED FLAGS: 06/09/2022 Bowel or bladder incontinence: No Cauda equina syndrome: No     COGNITION: 06/09/2022            Overall cognitive status: Within functional limits for tasks assessed                          SENSATION: 06/09/2022 unremarkable   MUSCLE LENGTH: 06/09/2022 No specific measurements today   POSTURE: 06/09/2022 Increased thoracic kyphosis, Lt lateral trunk lean in sitting/standing, forward head posture, forward trunk lean, reduced lumbar lordosis   PALPATION: 06/09/2022 Tenderness to palpation over Lt glute med muscle   LUMBAR ROM:    Active  AROM  06/09/2022 AROM 09/25/2022  Flexion WNL WFL  Extension To neutral To neutral  Right lateral flexion     Left lateral flexion     Right rotation     Left rotation      (Blank rows = not tested)    LOWER EXTREMITY MMT:     Seated MMT Right 06/09/2022 Left 06/09/2022 R/L 09/25/2022  Hip flexion 5/5 5/5   Hip extension       Hip abduction 3/5 3-/5   Hip adduction       Hip internal rotation       Hip external rotation       Knee flexion 5/5 5/5   Knee extension 4/5 4/5 4/4+  Ankle dorsiflexion 5/5 5/5   Ankle plantarflexion       Ankle inversion       Ankle eversion        (Blank rows = not tested)   LUMBAR SPECIAL TESTS:  06/09/2022 No specific testing.    FUNCTIONAL TESTS:  07/13/2022:   TUG : 16.9 seconds c SBA 09/25/2022:   TUG 21.18 seconds c SBA  06/09/2022 Timed up and go (TUG): 26 seconds independently performed     [facility] PT Assessment - 06/09/22 0001  Balance    Balance Assessed Yes          Standardized Balance Assessment    Standardized Balance Assessment Berg  Balance Test          Berg Balance Test    Sit to Stand Able to stand  independently using hands     Standing Unsupported Able to stand 2 minutes with supervision     Sitting with Back Unsupported but Feet Supported on Floor or Stool Able to sit safely and securely 2 minutes     Stand to Sit Uses backs of legs against chair to control descent     Transfers Able to transfer safely, definite need of hands     Standing Unsupported with Eyes Closed Able to stand 10 seconds with supervision     Standing Unsupported with Feet Together Able to place feet together independently and stand for 1 minute with supervision     From Standing, Reach Forward with Outstretched Arm Can reach forward >12 cm safely (5")     From Standing Position, Pick up Object from Floor Able to pick up shoe, needs supervision     From Standing Position, Turn to Look Behind Over each Shoulder Needs supervision when turning     Turn 360 Degrees Needs close supervision or verbal cueing     Standing Unsupported, Alternately Place Feet on Step/Stool Needs assistance to keep from falling or unable to try     Standing Unsupported, One Foot in Front Able to take small step independently and hold 30 seconds     Standing on One Leg Tries to lift leg/unable to hold 3 seconds but remains standing independently     Total Score 32                 BERG BALANCE TEST Sitting to Standing: 4.      Stands without using hands and stabilize independently Standing Unsupported: 4.      Stands safely for 2 minutes Sitting Unsupported: 4.     Sits for 2 minutes independently Standing to Sitting: 4.     Sits safely with minimal use of hands Transfers: 4.     Transfers safely with minor use of hands Standing with eyes closed: 3.     Stands 10 seconds with supervision Standing with feet together: 4.     Stands for 1 minute safely Reaching forward with outstretched arm: 2.     Reaches forward 2 inches Retrieving object from the floor: 3.     Able  to pick up with supervision Turning to look behind: 2.     Turns sideways only, maintains balance Turning 360 degrees: 2.     Able to turn slowly, but safely Place alternate foot on stool: 0.     Unable, needs assist to keep from falling Standing with one foot in front: 0.     Loses balance while standing/stepping Standing on one foot: 1.     Holds <3 seconds  Total Score: 37/56      TRANSFERS: 07/29/2022:  18 inch chair transfer with UE upon lowering due to pain  06/09/2022                     Sit to/from sidelying: independent with scooting legs and bottom and lifting legs to bed when lying to Lt and ModA to lift LLE to bed when lying to Rt, painful when shifting hip and unweighting Lt hip   GAIT: 07/13/2022:  Ambulation  independently c SBA  < 50 ft observed in clinic.  Arrived and left appointment c hand assist from caregiver's arm.   06/23/2022:  Arrived c SPC use in Lt UE primarily within clinic.   06/09/2022 Household distances < 150 ft within clinic, combination of independent ambulation and use of daughter's arm for interlocked support.  Similar ambulation posture as noted in standing posture above.  Widened base of support, minimal hip extension bilateral noted c reduced clear in swing.       TODAY'S TREATMENT  08/05/2022: Nustep 5 min, level 5 Bed mobility  Gait training with head turns 2 laps of clinic ~143f , CGA Stepping over objects 4 laps, CGA  07/29/2022: Therex  Nustep lvl5 10 mins UE/LE  Seated clamshell green band alternating LE movement x 15 ea  Neuro Re-ed:  STS with alternating marching from chair with foam pad x 6  Standing looking over shoulder x 5 bil. SBA with visual cues for increased turn, no reported vertigo  Standing alternating UE reaching at shoulder height with mild to moderate reach beyond BOS and SBA  07/13/2022:   Therex  Nustep lvl5 10 mins UE/LE  Seated PF/DF x 10 bilateral (cues for home use)  TherActivity (to improve ambulation,  functional mobility ability)  TUG x 2  18 inch chair sit to stand to sit throughout visit no UE assist x 10  Neuro Re-ed  BIG inspired stepping fwd 2 x 10 bilateral c CGA c constant cues for movement pattern improvements  BIG inspired stepping reverse x 10 bilateral c CGA c constant cues for movement pattern improvements  07/07/2022:   Therex  Seated scapular retraction 2 sec hold x 10   Seated clam shell green band x 20 bilateral   Seated rows green 2x10 bilateral  Seated marching with GTB 2 x 1 min  Seated LAQ 2 x 5 bilateral   Sit to stand to sit no UE assist , slow lowering 2 x 5 - 21 inch table height  Nustep lvl5 10 mins UE/LE   PATIENT EDUCATION:  07/13/2022 Education details: HEP progression Person educated: Patient, daughter Education method: Explanation, Demonstration, Verbal cues, and Handouts Education comprehension: verbalized understanding, returned demonstration, and verbal cues required     HOME EXERCISE PROGRAM: Access Code: [email or web address]: [email or web address] Date: 07/13/2022 Prepared by: MScot Jun Exercises - Seated Gluteal Sets  - 2-3 x daily - 7 x weekly - 10-15 reps - Seated Single Leg Hip Abduction with Resistance  - 2-3 x daily - 7 x weekly - 10-15 reps - Seated Scapular Retraction  - 3-5 x daily - 7 x weekly - 10-15 reps - 3-5sec hold - Sit to Stand  - 1 x daily - 7 x weekly - 1-2 sets - 5-10 reps - Seated Straight Leg Heel Taps  - 1-2 x daily - 7 x weekly - 3 sets - 5 reps - Seated Shoulder Row with Anchored Resistance  - 1-2 x daily - 7 x weekly - 1-2 sets - 10 reps - Seated Toe Raise  - 1-2 x daily - 7 x weekly - 1-2 sets - 10 reps  09/25/2022 Access Code: [email or web address]: [email or web address] Date: 08/25/2022 Prepared by: SRudi Heap Exercises - Seated Gluteal Sets  - 2-3 x daily - 7 x weekly - 10-15 reps - Seated Single Leg Hip Abduction with Resistance  - 2-3 x daily - 7 x weekly - 10-15 reps - Seated  Scapular Retraction  - 3-5 x daily -  7 x weekly - 10-15 reps - 3-5sec hold - Sit to Stand  - 1 x daily - 7 x weekly - 1-2 sets - 5-10 reps - Seated Straight Leg Heel Taps  - 1-2 x daily - 7 x weekly - 3 sets - 5 reps - Seated Shoulder Row with Anchored Resistance  - 1-2 x daily - 7 x weekly - 1-2 sets - 10 reps - Seated Toe Raise  - 1-2 x daily - 7 x weekly - 1-2 sets - 10 reps - Seated Shoulder Extension and Scapular Retraction with Resistance  - 1-2 x daily - 7 x weekly - 1-2 sets - 10 reps - Seated Long Arc Quad  - 1-2 x daily - 7 x weekly - 1-2 sets - 10 reps - 2 hold - Seated Shoulder Overhead Press with Dumbbells with PLB  - 1-2 x daily - 7 x weekly - 1-2 sets - 10 reps - Standing Hip Abduction with Counter Support  - 1-2 x daily - 7 x weekly - 1-2 sets - 10 reps - Standing March with Counter Support  - 1-2 x daily - 7 x weekly - 1-2 sets - 10 reps - Standing Hip Extension with Counter Support  - 1-2 x daily - 7 x weekly - 1-2 sets - 10 reps - Side Stepping with Counter Support  - 1-2 x daily - 7 x weekly - 1-2 sets - 10 reps   ASSESSMENT:   CLINICAL IMPRESSION: RENISHA COCKRUM has progressed fair with therapy. She has made slight improvements with her functional strength in bilat LE's, and balance. She continues to require SBA for long distance ambulation, and relies heavily on her family members for support. Pt is very self limiting and requires a lot of verbal encouragement to participate in activities. Pt made improvements with her BERG balance, while her TUG decreased significantly today. Pt will benefit from group classes to maintain her current level of function. Please see GOALS section for progress on short term and long term goals established at evaluation.  I recommend D/C home with HEP; pt agrees with plan.    OBJECTIVE IMPAIRMENTS Abnormal gait, decreased activity tolerance, decreased balance, decreased coordination, decreased endurance, decreased mobility, difficulty  walking, decreased ROM, decreased strength, hypomobility, increased fascial restrictions, impaired perceived functional ability, impaired flexibility, improper body mechanics, postural dysfunction, and pain.    ACTIVITY LIMITATIONS carrying, lifting, bending, standing, squatting, sleeping, stairs, transfers, bed mobility, and locomotion level   PARTICIPATION LIMITATIONS: cleaning, interpersonal relationship, shopping, community activity, and yard work   PERSONAL FACTORS CKD, Anxiety, HTN, osteoporosis are also affecting patient's functional outcome.    REHAB POTENTIAL: Good   CLINICAL DECISION MAKING: Evolving/moderate complexity   EVALUATION COMPLEXITY: Moderate     GOALS: Goals reviewed with patient? Yes   Short term PT Goals (target date for Short term goals are 3 weeks 06/30/2022) Patient will demonstrate independent use of home exercise program to maintain progress from in clinic treatments. Goal status: on going -  assessed 09/25/2022   Long term PT goals (target dates for all long term goals are 10 weeks  08/18/2022 )   1. Patient will demonstrate/report pain at worst less than or equal to 2/10 to facilitate minimal limitation in daily activity secondary to pain symptoms. Goal status: on going - assessed 09/25/2022, pt reports 5/10 pain with activity.   2. Patient will demonstrate independent use of home exercise program to facilitate ability to maintain/progress functional gains from skilled physical therapy services.  Goal status: on going - assessed 09/25/2022   3. Patient will demonstrate FOTO outcome > or = 54 % to indicate reduced disability due to condition. Goal status: on going - assessed 09/25/2022 - 41.6300%   4.  Patient will demonstrate lumbar extension 25 or greater % WFL s symptoms to facilitate upright standing, walking posture at PLOF s limitation. Goal status: on going - assessed 09/25/2022   5.  Patient will demonstrate bilateral knee extension MMT 5/5, hip  abduction > or = 4/5 to faciitate transfers, mobility independently without restriction.   Goal status: on going - assessed 09/25/2022 See above.    6.  Patient will demonstrate BERG scoring > 45 to indicate reduced fall risk.   Goal status: on going - assessed 09/25/2022 see above   7.  Patient will demonstrate TUG < = 14 seconds to indicate reduced fall risk for mobility improvements.  a.  Goal Status: on going - assessed 09/25/2022 see above     Lynden Ang, PT 08/25/2022, 4:06 PM

## 2022-08-24 DIAGNOSIS — N1832 Chronic kidney disease, stage 3b: Secondary | ICD-10-CM | POA: Diagnosis not present

## 2022-08-24 DIAGNOSIS — E559 Vitamin D deficiency, unspecified: Secondary | ICD-10-CM | POA: Diagnosis not present

## 2022-08-25 ENCOUNTER — Ambulatory Visit: Payer: Medicare HMO | Admitting: Physical Therapy

## 2022-08-25 DIAGNOSIS — R269 Unspecified abnormalities of gait and mobility: Secondary | ICD-10-CM

## 2022-08-25 DIAGNOSIS — M6281 Muscle weakness (generalized): Secondary | ICD-10-CM

## 2022-08-25 DIAGNOSIS — M25552 Pain in left hip: Secondary | ICD-10-CM | POA: Diagnosis not present

## 2022-08-25 DIAGNOSIS — M5459 Other low back pain: Secondary | ICD-10-CM | POA: Diagnosis not present

## 2022-08-25 DIAGNOSIS — R293 Abnormal posture: Secondary | ICD-10-CM

## 2022-08-25 DIAGNOSIS — N1832 Chronic kidney disease, stage 3b: Secondary | ICD-10-CM | POA: Diagnosis not present

## 2022-08-25 DIAGNOSIS — R262 Difficulty in walking, not elsewhere classified: Secondary | ICD-10-CM

## 2022-08-29 ENCOUNTER — Telehealth: Payer: Self-pay

## 2022-08-29 NOTE — Telephone Encounter (Signed)
Patient was treated with Amoxicillin 500 bid x f days for bacteria on urine culture 08/19/22.  She is calling to ask should she return for a follow up u/a for TOC?

## 2022-08-29 NOTE — Telephone Encounter (Signed)
Please have the patient return for a follow up visit if her symptoms have not improved since completing the course of antibiotics.  I believe she was having worsening in her baseline urinary control.

## 2022-08-30 NOTE — Telephone Encounter (Signed)
Spoke with patient and informed her.  She thinks overall she feels better but she wants to observe. She wants to go ahead and get on the schedule for a follow up visit with Dr. Talbert Nan but will cancel if decides she is indeed improved.   Message sent to appt desk to call her to schedule.

## 2022-08-30 NOTE — Telephone Encounter (Signed)
Follow up appt scheduled 09/07/22 with Wende Crease, NP.

## 2022-09-07 ENCOUNTER — Ambulatory Visit (INDEPENDENT_AMBULATORY_CARE_PROVIDER_SITE_OTHER): Payer: Medicare HMO | Admitting: Radiology

## 2022-09-07 VITALS — BP 130/68

## 2022-09-07 DIAGNOSIS — R3129 Other microscopic hematuria: Secondary | ICD-10-CM

## 2022-09-07 NOTE — Progress Notes (Signed)
      SUBJECTIVE: Katie Rubio is a 88 y.o. female was treated for a UTI 08/22/22 with Amoxicillin. She had microhematuria and urinary incontinence when she originally presented 08/11/22. No current symptoms, wants to be sure infection is gone. Hx of known kidney disease.   No Known Allergies  Current Outpatient Medications on File Prior to Visit  Medication Sig Dispense Refill   Cholecalciferol (VITAMIN D) 2000 UNITS CAPS Take 2,000 Units by mouth every other day.      clobetasol ointment (TEMOVATE) 0.05 % Apply a small amount topically BID for up to 2 weeks prn. Can use the ointment 2 x a week baseline as needed. Not for daily long term use. 60 g 1   Cyanocobalamin (VITAMIN B 12 PO) Take 1 tablet by mouth daily.     denosumab (PROLIA) 60 MG/ML SOSY injection 60 mg     magnesium 30 MG tablet Take 30 mg by mouth daily.     metoprolol tartrate (LOPRESSOR) 25 MG tablet TAKE (1/2) TABLET TWICE DAILY. 60 tablet 0   polyethylene glycol (MIRALAX) 17 g packet Take 17 g by mouth daily. 14 each 0   Tragacanth (ASTRAGALUS ROOT) POWD 1     Turmeric 500 MG CAPS Take by mouth.     Coenzyme Q10 (CO Q 10 PO) Take by mouth every other day. (Patient not taking: Reported on 08/11/2022)     Flaxseed, Linseed, (FLAXSEED OIL) 1000 MG CAPS Take by mouth daily. (Patient not taking: Reported on 08/11/2022)     Grape Seed 50 MG TABS as needed. (Patient not taking: Reported on 08/11/2022)     Omega-3 Fatty Acids (FISH OIL PO) Take by mouth daily.  (Patient not taking: Reported on 08/11/2022)     Valerian Root 100 MG CAPS as needed. (Patient not taking: Reported on 08/11/2022)     No current facility-administered medications on file prior to visit.    Past Medical History:  Diagnosis Date   Allergic rhinitis    Anxiety    Atrophic vaginitis    Benign head tremor    CKD (chronic kidney disease) stage 3, GFR 30-59 ml/min (HCC)    Dizziness    Elevated serum glutamic pyruvic transaminase (SGPT) level    History  of anxiety    History of rheumatic fever    Hypertension    Lung nodule    Macrocytosis    History of chronic macrocytosis   Melanoma (HCC)    Meralgia paresthetica    History of   NSVT (nonsustained ventricular tachycardia) (HCC)    Osteoporosis    Palpitations    occasional   Post-menopausal    Scarlet fever    Sclerosis of the skin    lichen   Splenic artery aneurysm (HCC)    Syncope    Tinnitus    Chronic     OBJECTIVE: Appears well, in no apparent distress.  Vital signs are normal. The abdomen is soft without tenderness, guarding, mass, rebound or organomegaly. No CVA tenderness or inguinal adenopathy noted. Urine dipstick shows positive for RBC's and positive for protein.  Micro exam: 0-2 RBC's per HPF and few+ bacteria.    Today's Vitals   09/07/22 1117  BP: 130/68   There is no height or weight on file to calculate BMI.    ASSESSMENT/PLAN: 1. Microhematuria  - Urinalysis,Complete w/RFL Culture Will send urine culture and sensitivity.

## 2022-09-08 LAB — URINALYSIS, COMPLETE W/RFL CULTURE
Bilirubin Urine: NEGATIVE
Glucose, UA: NEGATIVE
Hyaline Cast: NONE SEEN /LPF
Ketones, ur: NEGATIVE
Nitrites, Initial: NEGATIVE
Specific Gravity, Urine: 1.025 (ref 1.001–1.035)
pH: 5.5 (ref 5.0–8.0)

## 2022-09-08 LAB — CULTURE INDICATED

## 2022-09-08 LAB — URINE CULTURE
MICRO NUMBER:: 14018574
SPECIMEN QUALITY:: ADEQUATE

## 2022-09-10 LAB — URINALYSIS, COMPLETE W/RFL CULTURE
Bilirubin Urine: NEGATIVE
Glucose, UA: NEGATIVE
Hyaline Cast: NONE SEEN /LPF
Ketones, ur: NEGATIVE
Leukocyte Esterase: NEGATIVE
Nitrites, Initial: NEGATIVE
Specific Gravity, Urine: 1.025 (ref 1.001–1.035)
WBC, UA: NONE SEEN /HPF (ref 0–5)
pH: 5.5 (ref 5.0–8.0)

## 2022-09-10 LAB — URINE CULTURE
MICRO NUMBER:: 14099219
SPECIMEN QUALITY:: ADEQUATE

## 2022-09-10 LAB — CULTURE INDICATED

## 2022-09-13 ENCOUNTER — Other Ambulatory Visit: Payer: Self-pay | Admitting: *Deleted

## 2022-09-13 MED ORDER — SULFAMETHOXAZOLE-TRIMETHOPRIM 800-160 MG PO TABS: 1.0000 | ORAL_TABLET | Freq: Two times a day (BID) | ORAL | 0 refills | Status: AC

## 2022-10-14 ENCOUNTER — Other Ambulatory Visit: Payer: Medicare HMO

## 2022-10-14 ENCOUNTER — Telehealth: Payer: Self-pay | Admitting: *Deleted

## 2022-10-14 DIAGNOSIS — R3129 Other microscopic hematuria: Secondary | ICD-10-CM

## 2022-10-14 NOTE — Telephone Encounter (Signed)
Patient informed, she will call back one she checks with her daughter about a time to bring her.

## 2022-10-14 NOTE — Telephone Encounter (Signed)
Yes, that is fine for lab visit.

## 2022-10-14 NOTE — Telephone Encounter (Signed)
Patient called has been treated for UTI twice recently, she asked if okay to come in and leave urine to confirm UTI is completely gone? Patient is not having any symptoms.  Please advise

## 2022-10-14 NOTE — Telephone Encounter (Signed)
Patient came today for urine repeat.

## 2022-10-16 LAB — URINALYSIS, COMPLETE W/RFL CULTURE
Bacteria, UA: NONE SEEN /HPF
Bilirubin Urine: NEGATIVE
Casts: NONE SEEN /LPF
Crystals: NONE SEEN /HPF
Glucose, UA: NEGATIVE
Hyaline Cast: NONE SEEN /LPF
Ketones, ur: NEGATIVE
Leukocyte Esterase: NEGATIVE
Nitrites, Initial: NEGATIVE
Specific Gravity, Urine: 1.02 (ref 1.001–1.035)
WBC, UA: NONE SEEN /HPF (ref 0–5)
Yeast: NONE SEEN /HPF
pH: 6 (ref 5.0–8.0)

## 2022-10-16 LAB — URINE CULTURE
MICRO NUMBER:: 14257885
SPECIMEN QUALITY:: ADEQUATE

## 2022-10-16 LAB — CULTURE INDICATED

## 2022-10-26 DIAGNOSIS — Z23 Encounter for immunization: Secondary | ICD-10-CM | POA: Diagnosis not present

## 2022-10-28 DIAGNOSIS — Z23 Encounter for immunization: Secondary | ICD-10-CM | POA: Diagnosis not present

## 2022-11-18 DIAGNOSIS — L603 Nail dystrophy: Secondary | ICD-10-CM | POA: Diagnosis not present

## 2022-11-18 DIAGNOSIS — L84 Corns and callosities: Secondary | ICD-10-CM | POA: Diagnosis not present

## 2022-11-18 DIAGNOSIS — I739 Peripheral vascular disease, unspecified: Secondary | ICD-10-CM | POA: Diagnosis not present

## 2022-11-23 DIAGNOSIS — M810 Age-related osteoporosis without current pathological fracture: Secondary | ICD-10-CM | POA: Diagnosis not present

## 2022-11-23 DIAGNOSIS — I10 Essential (primary) hypertension: Secondary | ICD-10-CM | POA: Diagnosis not present

## 2022-11-23 DIAGNOSIS — I4892 Unspecified atrial flutter: Secondary | ICD-10-CM | POA: Diagnosis not present

## 2022-11-23 DIAGNOSIS — Z809 Family history of malignant neoplasm, unspecified: Secondary | ICD-10-CM | POA: Diagnosis not present

## 2022-11-23 DIAGNOSIS — Z87891 Personal history of nicotine dependence: Secondary | ICD-10-CM | POA: Diagnosis not present

## 2022-11-23 DIAGNOSIS — Z8249 Family history of ischemic heart disease and other diseases of the circulatory system: Secondary | ICD-10-CM | POA: Diagnosis not present

## 2022-11-23 DIAGNOSIS — Z961 Presence of intraocular lens: Secondary | ICD-10-CM | POA: Diagnosis not present

## 2022-11-23 DIAGNOSIS — R269 Unspecified abnormalities of gait and mobility: Secondary | ICD-10-CM | POA: Diagnosis not present

## 2022-11-23 DIAGNOSIS — Z008 Encounter for other general examination: Secondary | ICD-10-CM | POA: Diagnosis not present

## 2022-11-23 DIAGNOSIS — M1990 Unspecified osteoarthritis, unspecified site: Secondary | ICD-10-CM | POA: Diagnosis not present

## 2022-12-08 DIAGNOSIS — M810 Age-related osteoporosis without current pathological fracture: Secondary | ICD-10-CM | POA: Diagnosis not present

## 2022-12-19 ENCOUNTER — Ambulatory Visit (INDEPENDENT_AMBULATORY_CARE_PROVIDER_SITE_OTHER): Payer: Medicare HMO | Admitting: Obstetrics and Gynecology

## 2022-12-19 ENCOUNTER — Encounter: Payer: Self-pay | Admitting: Obstetrics and Gynecology

## 2022-12-19 VITALS — BP 135/100 | HR 80 | Ht 60.0 in | Wt 117.0 lb

## 2022-12-19 DIAGNOSIS — R350 Frequency of micturition: Secondary | ICD-10-CM | POA: Diagnosis not present

## 2022-12-19 DIAGNOSIS — N898 Other specified noninflammatory disorders of vagina: Secondary | ICD-10-CM

## 2022-12-19 DIAGNOSIS — R32 Unspecified urinary incontinence: Secondary | ICD-10-CM | POA: Diagnosis not present

## 2022-12-19 DIAGNOSIS — L900 Lichen sclerosus et atrophicus: Secondary | ICD-10-CM

## 2022-12-19 NOTE — Progress Notes (Signed)
GYNECOLOGY  VISIT   HPI: 89 y.o.   Married  Caucasian  female   [phone] with Patient's last menstrual period was 11/14/1978 (approximate).   here for recheck for UTI. Pt has noticed itching and a bump inside the vaginal canal. Pt has noticed urinary frequency and leakage.   Her daughter is present for the visit today.  She is here for reassurance that her E Coli UTI 09/07/22 has been completely treated.    Denies dysuria.  No hematuria.  No fevers.  Has baseline back pain.  Increased urinary frequency at night.   No vaginal discharge.  Hx lichen sclerosus of vulva and perianal dermatitis.  She has a prescription for Clobetasol and is not using is regularly.  She feels a lump near the rectal opening.   Has urinary incontinence and she wears a Geographical information systems officer.   GYNECOLOGIC HISTORY: Patient's last menstrual period was 11/14/1978 (approximate). Contraception:  PMP Menopausal hormone therapy:  n/a Last mammogram:  09/23/08, 3D/Density B/Neg/BiRads1  Last pap smear:   04/06/15 neg        OB History     Gravida  8   Para  5   Term  5   Preterm  0   AB  3   Living  4      SAB  3   IAB  0   Ectopic  0   Multiple  0   Live Births  5              Patient Active Problem List   Diagnosis Date Noted   Abnormal gait 08/05/2021   Allergic rhinitis 08/05/2021   Benign essential hypertension 08/05/2021   Cardiac arrhythmia 08/05/2021   Chronic anxiety 08/05/2021   Chronic kidney disease, stage 3a ([place]) 08/05/2021   Chronic pain 08/05/2021   Chronic constipation 08/05/2021   Cramp in lower leg associated with rest 08/05/2021   Essential tremor 08/05/2021   Family history of ischemic heart disease (IHD) 08/05/2021   Osteoporosis    Premature atrial complex 01/28/2019   Elevated blood pressure reading in office without diagnosis of hypertension 01/28/2019   History of rheumatic fever 01/28/2019    Past Medical History:  Diagnosis Date   Allergic rhinitis     Anxiety    Atrophic vaginitis    Benign head tremor    CKD (chronic kidney disease) stage 3, GFR 30-59 ml/min (HCC)    Dizziness    Elevated serum glutamic pyruvic transaminase (SGPT) level    History of anxiety    History of rheumatic fever    Hypertension    Lung nodule    Macrocytosis    History of chronic macrocytosis   Melanoma (HCC)    Meralgia paresthetica    History of   NSVT (nonsustained ventricular tachycardia) (HCC)    Osteoporosis    Palpitations    occasional   Post-menopausal    Scarlet fever    Sclerosis of the skin    lichen   Splenic artery aneurysm (HCC)    Syncope    Tinnitus    Chronic    Past Surgical History:  Procedure Laterality Date   CATARACT EXTRACTION Bilateral    DENTAL SURGERY     SKIN CANCER EXCISION     TONSILLECTOMY      Current Outpatient Medications  Medication Sig Dispense Refill   Cholecalciferol (VITAMIN D) 2000 UNITS CAPS Take 2,000 Units by mouth every other day.      clobetasol ointment (TEMOVATE) 0.05 %  Apply a small amount topically BID for up to 2 weeks prn. Can use the ointment 2 x a week baseline as needed. Not for daily long term use. 60 g 1   Coenzyme Q10 (CO Q 10 PO) Take by mouth every other day.     Cyanocobalamin (VITAMIN B 12 PO) Take 1 tablet by mouth daily.     denosumab (PROLIA) 60 MG/ML SOSY injection 60 mg     Flaxseed, Linseed, (FLAXSEED OIL) 1000 MG CAPS Take by mouth daily.     magnesium 30 MG tablet Take 30 mg by mouth daily.     metoprolol tartrate (LOPRESSOR) 25 MG tablet TAKE (1/2) TABLET TWICE DAILY. 60 tablet 0   Omega-3 Fatty Acids (FISH OIL PO) Take by mouth daily.     polyethylene glycol (MIRALAX) 17 g packet Take 17 g by mouth daily. 14 each 0   sulfamethoxazole-trimethoprim (BACTRIM DS) 800-160 MG tablet Take 1 tablet by mouth 2 (two) times daily. 14 tablet 0   Tragacanth (ASTRAGALUS ROOT) POWD 1     Valerian Root 100 MG CAPS as needed.     Grape Seed 50 MG TABS as needed. (Patient not taking:  Reported on 12/19/2022)     Turmeric 500 MG CAPS Take by mouth. (Patient not taking: Reported on 12/19/2022)     No current facility-administered medications for this visit.     ALLERGIES: Patient has no known allergies.  Family History  Problem Relation Age of Onset   Heart attack Father    Cancer - Colon Mother 77   Hypertension Mother    Heart disease Sister    Cancer Son        muscle    Social History   Socioeconomic History   Marital status: Married    Spouse name: Not on file   Number of children: 5   Years of education: Not on file   Highest education level: Not on file  Occupational History   Occupation: retired  Tobacco Use   Smoking status: Former    Packs/day: 0.25    Years: 5.00    Total pack years: 1.25    Types: Cigarettes    Quit date: 1968    Years since quitting: 56.1   Smokeless tobacco: Never   Tobacco comments:    Pt was a some day smoker.  Vaping Use   Vaping Use: Never used  Substance and Sexual Activity   Alcohol use: No    Alcohol/week: 0.0 standard drinks of alcohol   Drug use: No   Sexual activity: Not Currently    Partners: Male    Birth control/protection: Abstinence, Post-menopausal  Other Topics Concern   Not on file  Social History Narrative   Not on file   Social Determinants of Health   Financial Resource Strain: Not on file  Food Insecurity: Not on file  Transportation Needs: Not on file  Physical Activity: Not on file  Stress: Not on file  Social Connections: Not on file  Intimate Partner Violence: Not on file    Review of Systems  Genitourinary:  Positive for frequency.    PHYSICAL EXAMINATION:    BP (!) 135/100 (BP Location: Right Arm, Patient Position: Sitting, Cuff Size: Normal)   Pulse 80   Ht 5' (1.524 m)   Wt 117 lb (53.1 kg)   LMP 11/14/1978 (Approximate)   SpO2 98%   BMI 22.85 kg/m     General appearance: alert, cooperative and appears stated age   Pelvic:  External genitalia:  agglutination of  hypopigmented labia minora near clitoris, ecchymoses of left superior labia minora.                Urethra:  normal appearing urethra with no masses, tenderness or lesions              Bartholins and Skenes: normal                 Vagina: normal appearing vagina with normal color and discharge, no lesions              Cervix: no lesions                Bimanual Exam:  Uterus:  normal size, contour, position, consistency, mobility, non-tender              Adnexa: no mass, fullness, tenderness              Rectal exam: yes.  Confirms.              Anus:  normal sphincter tone, circumferential hypopigmentation of the anal region.   Chaperone was present for exam:  Emily  ASSESSMENT  Urinary frequency.  Hx UTI. Lichen sclerosus of vulva and perianal region.  Chronic kidney disease.  PLAN  Urinalysis:  sg 1.021, pH 5.0, 0 - 5 WBC, 0 - 2 RBC, 0 - 5 squams, moderate bacteria, 0 - 1 hyaline cases, moderate mucus. UC sent.  No abx given today.  I encouraged her to use the clobetasol ointment bid x 2 weeks and then use it twice weekly at hs for maintenance dosing.  I recommended incontinence pads and underwear instead of sanitary pad use.  FU prn.     An After Visit Summary was printed and given to the patient.  25 min  total time was spent for this patient encounter, including preparation, face-to-face counseling with the patient, coordination of care, and documentation of the encounter.

## 2022-12-21 LAB — URINALYSIS, COMPLETE W/RFL CULTURE
Bilirubin Urine: NEGATIVE
Glucose, UA: NEGATIVE
Ketones, ur: NEGATIVE
Nitrites, Initial: NEGATIVE
Specific Gravity, Urine: 1.021 (ref 1.001–1.035)
pH: 5 (ref 5.0–8.0)

## 2022-12-21 LAB — URINE CULTURE
MICRO NUMBER:: 14518814
Result:: NO GROWTH
SPECIMEN QUALITY:: ADEQUATE

## 2022-12-21 LAB — CULTURE INDICATED

## 2023-01-11 DIAGNOSIS — L578 Other skin changes due to chronic exposure to nonionizing radiation: Secondary | ICD-10-CM | POA: Diagnosis not present

## 2023-01-11 DIAGNOSIS — L299 Pruritus, unspecified: Secondary | ICD-10-CM | POA: Diagnosis not present

## 2023-01-11 DIAGNOSIS — D225 Melanocytic nevi of trunk: Secondary | ICD-10-CM | POA: Diagnosis not present

## 2023-01-11 DIAGNOSIS — L821 Other seborrheic keratosis: Secondary | ICD-10-CM | POA: Diagnosis not present

## 2023-01-11 DIAGNOSIS — L814 Other melanin hyperpigmentation: Secondary | ICD-10-CM | POA: Diagnosis not present

## 2023-01-11 DIAGNOSIS — L219 Seborrheic dermatitis, unspecified: Secondary | ICD-10-CM | POA: Diagnosis not present

## 2023-01-11 DIAGNOSIS — L570 Actinic keratosis: Secondary | ICD-10-CM | POA: Diagnosis not present

## 2023-01-11 DIAGNOSIS — L820 Inflamed seborrheic keratosis: Secondary | ICD-10-CM | POA: Diagnosis not present

## 2023-03-22 DIAGNOSIS — L84 Corns and callosities: Secondary | ICD-10-CM | POA: Diagnosis not present

## 2023-03-22 DIAGNOSIS — I739 Peripheral vascular disease, unspecified: Secondary | ICD-10-CM | POA: Diagnosis not present

## 2023-03-22 DIAGNOSIS — L603 Nail dystrophy: Secondary | ICD-10-CM | POA: Diagnosis not present

## 2023-05-05 DIAGNOSIS — I10 Essential (primary) hypertension: Secondary | ICD-10-CM | POA: Diagnosis not present

## 2023-05-05 DIAGNOSIS — M810 Age-related osteoporosis without current pathological fracture: Secondary | ICD-10-CM | POA: Diagnosis not present

## 2023-05-12 DIAGNOSIS — Z1211 Encounter for screening for malignant neoplasm of colon: Secondary | ICD-10-CM | POA: Diagnosis not present

## 2023-05-12 DIAGNOSIS — I10 Essential (primary) hypertension: Secondary | ICD-10-CM | POA: Diagnosis not present

## 2023-05-12 DIAGNOSIS — R0989 Other specified symptoms and signs involving the circulatory and respiratory systems: Secondary | ICD-10-CM | POA: Diagnosis not present

## 2023-05-12 DIAGNOSIS — R413 Other amnesia: Secondary | ICD-10-CM | POA: Diagnosis not present

## 2023-05-12 DIAGNOSIS — M810 Age-related osteoporosis without current pathological fracture: Secondary | ICD-10-CM | POA: Diagnosis not present

## 2023-05-12 DIAGNOSIS — I4729 Other ventricular tachycardia: Secondary | ICD-10-CM | POA: Diagnosis not present

## 2023-05-12 DIAGNOSIS — Z0000 Encounter for general adult medical examination without abnormal findings: Secondary | ICD-10-CM | POA: Diagnosis not present

## 2023-05-12 DIAGNOSIS — J309 Allergic rhinitis, unspecified: Secondary | ICD-10-CM | POA: Diagnosis not present

## 2023-05-12 DIAGNOSIS — R2689 Other abnormalities of gait and mobility: Secondary | ICD-10-CM | POA: Diagnosis not present

## 2023-05-12 DIAGNOSIS — I472 Ventricular tachycardia, unspecified: Secondary | ICD-10-CM | POA: Diagnosis not present

## 2023-05-13 LAB — LAB REPORT - SCANNED: EGFR: 57

## 2023-05-17 DIAGNOSIS — Z1211 Encounter for screening for malignant neoplasm of colon: Secondary | ICD-10-CM | POA: Diagnosis not present

## 2023-05-17 DIAGNOSIS — R413 Other amnesia: Secondary | ICD-10-CM | POA: Diagnosis not present

## 2023-05-24 NOTE — Progress Notes (Signed)
HPI: FU VT. Previously followed by Sunit Tolia DO. Also with history of splenic artery aneurysm. Monitor January 2021 showed sinus rhythm with 4 episodes of nonsustained ventricular tachycardia (maximum number of beats 10). No symptoms reported. Echocardiogram January 2021 showed ejection fraction 55 to 60%, grade 1 diastolic dysfunction. Lexiscan nuclear study March 2021 showed ejection fraction 62% and normal perfusion. Patient started on metoprolol for nonsustained ventricular tachycardia. Since last seen, she denies dyspnea, chest pain, palpitations or syncope.  Current Outpatient Medications  Medication Sig Dispense Refill   Cholecalciferol (VITAMIN D) 2000 UNITS CAPS Take 2,000 Units by mouth every other day.      clobetasol ointment (TEMOVATE) 0.05 % Apply a small amount topically BID for up to 2 weeks prn. Can use the ointment 2 x a week baseline as needed. Not for daily long term use. 60 g 1   Coenzyme Q10 (CO Q 10 PO) Take by mouth every other day.     Cyanocobalamin (VITAMIN B 12 PO) Take 1 tablet by mouth daily.     denosumab (PROLIA) 60 MG/ML SOSY injection 60 mg     Flaxseed, Linseed, (FLAXSEED OIL) 1000 MG CAPS Take by mouth daily.     Grape Seed 50 MG TABS as needed.     magnesium 30 MG tablet Take 30 mg by mouth daily.     metoprolol tartrate (LOPRESSOR) 25 MG tablet TAKE (1/2) TABLET TWICE DAILY. 60 tablet 0   Omega-3 Fatty Acids (FISH OIL PO) Take by mouth daily.     polyethylene glycol (MIRALAX) 17 g packet Take 17 g by mouth daily. 14 each 0   sulfamethoxazole-trimethoprim (BACTRIM DS) 800-160 MG tablet Take 1 tablet by mouth 2 (two) times daily. 14 tablet 0   Tragacanth (ASTRAGALUS ROOT) POWD 1     Turmeric 500 MG CAPS Take by mouth.     Valerian Root 100 MG CAPS as needed.     No current facility-administered medications for this visit.     Past Medical History:  Diagnosis Date   Allergic rhinitis    Anxiety    Atrophic vaginitis    Benign head tremor     CKD (chronic kidney disease) stage 3, GFR 30-59 ml/min (HCC)    Dizziness    Elevated serum glutamic pyruvic transaminase (SGPT) level    History of anxiety    History of rheumatic fever    Hypertension    Lung nodule    Macrocytosis    History of chronic macrocytosis   Melanoma (HCC)    Meralgia paresthetica    History of   NSVT (nonsustained ventricular tachycardia) (HCC)    Osteoporosis    Palpitations    occasional   Post-menopausal    Scarlet fever    Sclerosis of the skin    lichen   Splenic artery aneurysm (HCC)    Syncope    Tinnitus    Chronic    Past Surgical History:  Procedure Laterality Date   CATARACT EXTRACTION Bilateral    DENTAL SURGERY     SKIN CANCER EXCISION     TONSILLECTOMY      Social History   Socioeconomic History   Marital status: Married    Spouse name: Not on file   Number of children: 5   Years of education: Not on file   Highest education level: Not on file  Occupational History   Occupation: retired  Tobacco Use   Smoking status: Former    Current packs/day: 0.00  Average packs/day: 0.3 packs/day for 5.0 years (1.3 ttl pk-yrs)    Types: Cigarettes    Start date: 61    Quit date: 1968    Years since quitting: 56.5   Smokeless tobacco: Never   Tobacco comments:    Pt was a some day smoker.  Vaping Use   Vaping status: Never Used  Substance and Sexual Activity   Alcohol use: No    Alcohol/week: 0.0 standard drinks of alcohol   Drug use: No   Sexual activity: Not Currently    Partners: Male    Birth control/protection: Abstinence, Post-menopausal  Other Topics Concern   Not on file  Social History Narrative   Not on file   Social Determinants of Health   Financial Resource Strain: Not on file  Food Insecurity: Not on file  Transportation Needs: Not on file  Physical Activity: Not on file  Stress: Not on file  Social Connections: Not on file  Intimate Partner Violence: Not on file    Family History  Problem  Relation Age of Onset   Heart attack Father    Cancer - Colon Mother 32   Hypertension Mother    Heart disease Sister    Cancer Son        muscle    ROS: no fevers or chills, productive cough, hemoptysis, dysphasia, odynophagia, melena, hematochezia, dysuria, hematuria, rash, seizure activity, orthopnea, PND, pedal edema, claudication. Remaining systems are negative.  Physical Exam: Well-developed well-nourished in no acute distress.  Skin is warm and dry.  HEENT is normal.  Neck is supple.  Chest is clear to auscultation with normal expansion.  Cardiovascular exam is regular rate and rhythm.  Abdominal exam nontender or distended. No masses palpated. Extremities show no edema. neuro grossly intact  EKG Interpretation Date/Time:  Wednesday May 31 2023 09:24:39 EDT Ventricular Rate:  79 PR Interval:  140 QRS Duration:  64 QT Interval:  350 QTC Calculation: 401 R Axis:   26  Text Interpretation: Normal sinus rhythm Low voltage QRS No previous ECGs available Confirmed by Olga Millers ([id number]) on 05/31/2023 9:25:59 AM    A/P  1 history of nonsustained ventricular tachycardia-this was noted on previous monitor.  However LV function is normal and she has had no symptoms.  Will continue beta-blocker at present dose.  2 hypertension-blood pressure controlled.  Continue present medical regimen and follow.  Olga Millers, MD

## 2023-05-31 ENCOUNTER — Encounter: Payer: Self-pay | Admitting: Cardiology

## 2023-05-31 ENCOUNTER — Ambulatory Visit: Payer: Medicare HMO | Attending: Cardiology | Admitting: Cardiology

## 2023-05-31 VITALS — BP 132/62 | HR 79 | Ht 61.0 in | Wt 117.6 lb

## 2023-05-31 DIAGNOSIS — I10 Essential (primary) hypertension: Secondary | ICD-10-CM | POA: Diagnosis not present

## 2023-05-31 DIAGNOSIS — I4729 Other ventricular tachycardia: Secondary | ICD-10-CM

## 2023-05-31 DIAGNOSIS — R002 Palpitations: Secondary | ICD-10-CM

## 2023-05-31 NOTE — Patient Instructions (Signed)
  Follow-Up: At [facility], you and your health needs are our priority.  As part of our continuing mission to provide you with exceptional heart care, we have created designated Provider Care Teams.  These Care Teams include your primary Cardiologist (physician) and Advanced Practice Providers (APPs -  Physician Assistants and Nurse Practitioners) who all work together to provide you with the care you need, when you need it.  We recommend signing up for the patient portal called "MyChart".  Sign up information is provided on this After Visit Summary.  MyChart is used to connect with patients for Virtual Visits (Telemedicine).  Patients are able to view lab/test results, encounter notes, upcoming appointments, etc.  Non-urgent messages can be sent to your provider as well.   To learn more about what you can do with MyChart, go to [email or web address].    Your next appointment:   12 month(s)  Provider:   Brian Crenshaw MD    

## 2023-06-09 DIAGNOSIS — M810 Age-related osteoporosis without current pathological fracture: Secondary | ICD-10-CM | POA: Diagnosis not present

## 2023-06-14 DIAGNOSIS — R0989 Other specified symptoms and signs involving the circulatory and respiratory systems: Secondary | ICD-10-CM | POA: Diagnosis not present

## 2023-06-15 DIAGNOSIS — N390 Urinary tract infection, site not specified: Secondary | ICD-10-CM | POA: Diagnosis not present

## 2023-06-23 DIAGNOSIS — L603 Nail dystrophy: Secondary | ICD-10-CM | POA: Diagnosis not present

## 2023-06-23 DIAGNOSIS — I739 Peripheral vascular disease, unspecified: Secondary | ICD-10-CM | POA: Diagnosis not present

## 2023-06-23 DIAGNOSIS — L84 Corns and callosities: Secondary | ICD-10-CM | POA: Diagnosis not present

## 2023-08-17 ENCOUNTER — Ambulatory Visit: Payer: Medicare HMO | Admitting: Obstetrics and Gynecology

## 2023-08-29 DIAGNOSIS — Z01419 Encounter for gynecological examination (general) (routine) without abnormal findings: Secondary | ICD-10-CM | POA: Diagnosis not present

## 2023-08-29 DIAGNOSIS — Z780 Asymptomatic menopausal state: Secondary | ICD-10-CM | POA: Diagnosis not present

## 2023-08-29 DIAGNOSIS — N3944 Nocturnal enuresis: Secondary | ICD-10-CM | POA: Diagnosis not present

## 2023-09-07 DIAGNOSIS — H16143 Punctate keratitis, bilateral: Secondary | ICD-10-CM | POA: Diagnosis not present

## 2023-09-07 DIAGNOSIS — H524 Presbyopia: Secondary | ICD-10-CM | POA: Diagnosis not present

## 2023-09-07 DIAGNOSIS — Z961 Presence of intraocular lens: Secondary | ICD-10-CM | POA: Diagnosis not present

## 2023-09-07 DIAGNOSIS — H52223 Regular astigmatism, bilateral: Secondary | ICD-10-CM | POA: Diagnosis not present

## 2023-09-07 DIAGNOSIS — H5203 Hypermetropia, bilateral: Secondary | ICD-10-CM | POA: Diagnosis not present

## 2023-09-07 DIAGNOSIS — H43813 Vitreous degeneration, bilateral: Secondary | ICD-10-CM | POA: Diagnosis not present

## 2023-09-07 DIAGNOSIS — H35363 Drusen (degenerative) of macula, bilateral: Secondary | ICD-10-CM | POA: Diagnosis not present

## 2023-09-22 DIAGNOSIS — L84 Corns and callosities: Secondary | ICD-10-CM | POA: Diagnosis not present

## 2023-09-22 DIAGNOSIS — L603 Nail dystrophy: Secondary | ICD-10-CM | POA: Diagnosis not present

## 2023-09-22 DIAGNOSIS — I739 Peripheral vascular disease, unspecified: Secondary | ICD-10-CM | POA: Diagnosis not present

## 2023-11-14 DIAGNOSIS — E559 Vitamin D deficiency, unspecified: Secondary | ICD-10-CM | POA: Diagnosis not present

## 2023-11-14 DIAGNOSIS — I10 Essential (primary) hypertension: Secondary | ICD-10-CM | POA: Diagnosis not present

## 2023-11-14 DIAGNOSIS — D485 Neoplasm of uncertain behavior of skin: Secondary | ICD-10-CM | POA: Diagnosis not present

## 2023-12-06 DIAGNOSIS — L814 Other melanin hyperpigmentation: Secondary | ICD-10-CM | POA: Diagnosis not present

## 2023-12-06 DIAGNOSIS — L853 Xerosis cutis: Secondary | ICD-10-CM | POA: Diagnosis not present

## 2023-12-06 DIAGNOSIS — Z7189 Other specified counseling: Secondary | ICD-10-CM | POA: Diagnosis not present

## 2023-12-06 DIAGNOSIS — D225 Melanocytic nevi of trunk: Secondary | ICD-10-CM | POA: Diagnosis not present

## 2023-12-06 DIAGNOSIS — L309 Dermatitis, unspecified: Secondary | ICD-10-CM | POA: Diagnosis not present

## 2023-12-06 DIAGNOSIS — L821 Other seborrheic keratosis: Secondary | ICD-10-CM | POA: Diagnosis not present

## 2023-12-06 DIAGNOSIS — L570 Actinic keratosis: Secondary | ICD-10-CM | POA: Diagnosis not present

## 2023-12-15 DIAGNOSIS — M810 Age-related osteoporosis without current pathological fracture: Secondary | ICD-10-CM | POA: Diagnosis not present

## 2023-12-20 DIAGNOSIS — M545 Low back pain, unspecified: Secondary | ICD-10-CM | POA: Diagnosis not present

## 2023-12-20 DIAGNOSIS — M25552 Pain in left hip: Secondary | ICD-10-CM | POA: Diagnosis not present

## 2024-01-08 DIAGNOSIS — C44321 Squamous cell carcinoma of skin of nose: Secondary | ICD-10-CM | POA: Diagnosis not present

## 2024-01-08 DIAGNOSIS — D485 Neoplasm of uncertain behavior of skin: Secondary | ICD-10-CM | POA: Diagnosis not present

## 2024-01-12 DIAGNOSIS — L814 Other melanin hyperpigmentation: Secondary | ICD-10-CM | POA: Diagnosis not present

## 2024-01-12 DIAGNOSIS — C44321 Squamous cell carcinoma of skin of nose: Secondary | ICD-10-CM | POA: Diagnosis not present

## 2024-01-12 DIAGNOSIS — D485 Neoplasm of uncertain behavior of skin: Secondary | ICD-10-CM | POA: Diagnosis not present

## 2024-01-29 DIAGNOSIS — C44321 Squamous cell carcinoma of skin of nose: Secondary | ICD-10-CM | POA: Diagnosis not present

## 2024-02-28 DIAGNOSIS — I739 Peripheral vascular disease, unspecified: Secondary | ICD-10-CM | POA: Diagnosis not present

## 2024-02-28 DIAGNOSIS — L603 Nail dystrophy: Secondary | ICD-10-CM | POA: Diagnosis not present

## 2024-02-28 DIAGNOSIS — D492 Neoplasm of unspecified behavior of bone, soft tissue, and skin: Secondary | ICD-10-CM | POA: Diagnosis not present

## 2024-03-14 DIAGNOSIS — R3129 Other microscopic hematuria: Secondary | ICD-10-CM | POA: Diagnosis not present

## 2024-03-14 DIAGNOSIS — R350 Frequency of micturition: Secondary | ICD-10-CM | POA: Diagnosis not present

## 2024-04-19 ENCOUNTER — Encounter: Payer: Self-pay | Admitting: Cardiology

## 2024-05-09 DIAGNOSIS — M810 Age-related osteoporosis without current pathological fracture: Secondary | ICD-10-CM | POA: Diagnosis not present

## 2024-05-09 DIAGNOSIS — I10 Essential (primary) hypertension: Secondary | ICD-10-CM | POA: Diagnosis not present

## 2024-05-09 DIAGNOSIS — L578 Other skin changes due to chronic exposure to nonionizing radiation: Secondary | ICD-10-CM | POA: Diagnosis not present

## 2024-05-09 DIAGNOSIS — Z85828 Personal history of other malignant neoplasm of skin: Secondary | ICD-10-CM | POA: Diagnosis not present

## 2024-05-09 DIAGNOSIS — Z08 Encounter for follow-up examination after completed treatment for malignant neoplasm: Secondary | ICD-10-CM | POA: Diagnosis not present

## 2024-05-09 DIAGNOSIS — L570 Actinic keratosis: Secondary | ICD-10-CM | POA: Diagnosis not present

## 2024-05-14 DIAGNOSIS — E559 Vitamin D deficiency, unspecified: Secondary | ICD-10-CM | POA: Diagnosis not present

## 2024-05-14 DIAGNOSIS — M810 Age-related osteoporosis without current pathological fracture: Secondary | ICD-10-CM | POA: Diagnosis not present

## 2024-05-14 DIAGNOSIS — Z0000 Encounter for general adult medical examination without abnormal findings: Secondary | ICD-10-CM | POA: Diagnosis not present

## 2024-05-14 DIAGNOSIS — J309 Allergic rhinitis, unspecified: Secondary | ICD-10-CM | POA: Diagnosis not present

## 2024-05-14 DIAGNOSIS — I10 Essential (primary) hypertension: Secondary | ICD-10-CM | POA: Diagnosis not present

## 2024-05-14 DIAGNOSIS — K5900 Constipation, unspecified: Secondary | ICD-10-CM | POA: Diagnosis not present

## 2024-05-14 DIAGNOSIS — N1831 Chronic kidney disease, stage 3a: Secondary | ICD-10-CM | POA: Diagnosis not present

## 2024-05-14 DIAGNOSIS — R140 Abdominal distension (gaseous): Secondary | ICD-10-CM | POA: Diagnosis not present

## 2024-05-22 DIAGNOSIS — L578 Other skin changes due to chronic exposure to nonionizing radiation: Secondary | ICD-10-CM | POA: Diagnosis not present

## 2024-05-22 DIAGNOSIS — R208 Other disturbances of skin sensation: Secondary | ICD-10-CM | POA: Diagnosis not present

## 2024-05-22 DIAGNOSIS — L538 Other specified erythematous conditions: Secondary | ICD-10-CM | POA: Diagnosis not present

## 2024-05-22 DIAGNOSIS — C44722 Squamous cell carcinoma of skin of right lower limb, including hip: Secondary | ICD-10-CM | POA: Diagnosis not present

## 2024-05-22 DIAGNOSIS — Z85828 Personal history of other malignant neoplasm of skin: Secondary | ICD-10-CM | POA: Diagnosis not present

## 2024-05-22 DIAGNOSIS — D485 Neoplasm of uncertain behavior of skin: Secondary | ICD-10-CM | POA: Diagnosis not present

## 2024-05-22 DIAGNOSIS — Z08 Encounter for follow-up examination after completed treatment for malignant neoplasm: Secondary | ICD-10-CM | POA: Diagnosis not present

## 2024-05-22 DIAGNOSIS — L570 Actinic keratosis: Secondary | ICD-10-CM | POA: Diagnosis not present

## 2024-05-24 DIAGNOSIS — H26493 Other secondary cataract, bilateral: Secondary | ICD-10-CM | POA: Diagnosis not present

## 2024-05-24 DIAGNOSIS — H52222 Regular astigmatism, left eye: Secondary | ICD-10-CM | POA: Diagnosis not present

## 2024-05-24 DIAGNOSIS — H02209 Unspecified lagophthalmos unspecified eye, unspecified eyelid: Secondary | ICD-10-CM | POA: Diagnosis not present

## 2024-05-24 DIAGNOSIS — H16143 Punctate keratitis, bilateral: Secondary | ICD-10-CM | POA: Diagnosis not present

## 2024-05-24 DIAGNOSIS — H524 Presbyopia: Secondary | ICD-10-CM | POA: Diagnosis not present

## 2024-05-24 DIAGNOSIS — Z961 Presence of intraocular lens: Secondary | ICD-10-CM | POA: Diagnosis not present

## 2024-05-24 DIAGNOSIS — H43813 Vitreous degeneration, bilateral: Secondary | ICD-10-CM | POA: Diagnosis not present

## 2024-05-24 DIAGNOSIS — H35363 Drusen (degenerative) of macula, bilateral: Secondary | ICD-10-CM | POA: Diagnosis not present

## 2024-05-24 DIAGNOSIS — H5202 Hypermetropia, left eye: Secondary | ICD-10-CM | POA: Diagnosis not present

## 2024-05-29 ENCOUNTER — Ambulatory Visit: Admitting: Podiatry

## 2024-05-29 DIAGNOSIS — M79672 Pain in left foot: Secondary | ICD-10-CM

## 2024-05-29 DIAGNOSIS — M79671 Pain in right foot: Secondary | ICD-10-CM

## 2024-05-29 DIAGNOSIS — B351 Tinea unguium: Secondary | ICD-10-CM | POA: Diagnosis not present

## 2024-05-29 NOTE — Progress Notes (Signed)
 Patient presents for evaluation and treatment of tenderness and some redness around nails feet.  Tenderness around toes with walking and wearing shoes.  Physical exam:  General appearance: Alert, pleasant, and in no acute distress.  Vascular: Pedal pulses: DP 2/4 B/L, PT 0/4 B/L.  Mild edema lower legs bilaterally  Neurological:    Dermatologic:  Nails thickened, disfigured, discolored 1-5 BL with subungual debris.  Redness and hypertrophic nail folds along nail folds bilaterally but no signs of drainage or infection.  Musculoskeletal:     Diagnosis: 1. Painful onychomycotic nails 1 through 5 bilaterally. 2. Pain toes 1 through 5 bilaterally.  Plan: Debrided onychomycotic nails 1 through 5 bilaterally.  Return 3 months

## 2024-06-14 DIAGNOSIS — M810 Age-related osteoporosis without current pathological fracture: Secondary | ICD-10-CM | POA: Diagnosis not present

## 2024-06-18 DIAGNOSIS — L905 Scar conditions and fibrosis of skin: Secondary | ICD-10-CM | POA: Diagnosis not present

## 2024-06-18 DIAGNOSIS — C44722 Squamous cell carcinoma of skin of right lower limb, including hip: Secondary | ICD-10-CM | POA: Diagnosis not present

## 2024-08-27 ENCOUNTER — Ambulatory Visit: Admitting: Podiatry

## 2024-08-27 DIAGNOSIS — B351 Tinea unguium: Secondary | ICD-10-CM

## 2024-08-27 DIAGNOSIS — M79671 Pain in right foot: Secondary | ICD-10-CM | POA: Diagnosis not present

## 2024-08-27 DIAGNOSIS — M79672 Pain in left foot: Secondary | ICD-10-CM | POA: Diagnosis not present

## 2024-08-27 NOTE — Progress Notes (Signed)
 Patient presents for evaluation and treatment of tenderness and some redness around nails feet.  Tenderness around toes with walking and wearing shoes.  Physical exam:  General appearance: Alert, pleasant, and in no acute distress.  Vascular: Pedal pulses: DP 2/4 B/L, PT 0/4 B/L. Mild edema lower legs bilaterally  Neu  Dermatologic:  Nails thickened, disfigured, discolored 1-5 BL with subungual debris.  Redness and hypertrophic nail folds along nail folds bilaterally but no signs of drainage or infection.  Musculoskeletal:     Diagnosis: 1. Painful onychomycotic nails 1 through 5 bilaterally. 2. Pain toes 1 through 5 bilaterally.  Plan: -Debrided onychomycotic nails 1 through 5 bilaterally.  Sharply debrided nails with nail clipper and reduced with a power bur.  Return 3 months RFC

## 2024-08-29 ENCOUNTER — Ambulatory Visit: Admitting: Podiatry

## 2024-09-04 DIAGNOSIS — R339 Retention of urine, unspecified: Secondary | ICD-10-CM | POA: Diagnosis not present

## 2024-09-10 DIAGNOSIS — Z08 Encounter for follow-up examination after completed treatment for malignant neoplasm: Secondary | ICD-10-CM | POA: Diagnosis not present

## 2024-09-10 DIAGNOSIS — D1801 Hemangioma of skin and subcutaneous tissue: Secondary | ICD-10-CM | POA: Diagnosis not present

## 2024-09-10 DIAGNOSIS — C44311 Basal cell carcinoma of skin of nose: Secondary | ICD-10-CM | POA: Diagnosis not present

## 2024-09-10 DIAGNOSIS — Z872 Personal history of diseases of the skin and subcutaneous tissue: Secondary | ICD-10-CM | POA: Diagnosis not present

## 2024-09-10 DIAGNOSIS — D485 Neoplasm of uncertain behavior of skin: Secondary | ICD-10-CM | POA: Diagnosis not present

## 2024-09-10 DIAGNOSIS — L578 Other skin changes due to chronic exposure to nonionizing radiation: Secondary | ICD-10-CM | POA: Diagnosis not present

## 2024-09-10 DIAGNOSIS — L821 Other seborrheic keratosis: Secondary | ICD-10-CM | POA: Diagnosis not present

## 2024-09-10 DIAGNOSIS — L570 Actinic keratosis: Secondary | ICD-10-CM | POA: Diagnosis not present

## 2024-09-10 DIAGNOSIS — L853 Xerosis cutis: Secondary | ICD-10-CM | POA: Diagnosis not present

## 2024-09-10 DIAGNOSIS — Z09 Encounter for follow-up examination after completed treatment for conditions other than malignant neoplasm: Secondary | ICD-10-CM | POA: Diagnosis not present

## 2024-09-10 DIAGNOSIS — L814 Other melanin hyperpigmentation: Secondary | ICD-10-CM | POA: Diagnosis not present

## 2024-09-10 DIAGNOSIS — Z85828 Personal history of other malignant neoplasm of skin: Secondary | ICD-10-CM | POA: Diagnosis not present

## 2024-10-01 DIAGNOSIS — M810 Age-related osteoporosis without current pathological fracture: Secondary | ICD-10-CM | POA: Diagnosis not present

## 2024-10-02 DIAGNOSIS — C44311 Basal cell carcinoma of skin of nose: Secondary | ICD-10-CM | POA: Diagnosis not present

## 2024-10-09 NOTE — Progress Notes (Signed)
 HPI: FU VT. Also with history of splenic artery aneurysm. Monitor January 2021 showed sinus rhythm with 4 episodes of nonsustained ventricular tachycardia (maximum number of beats 10). No symptoms reported. Echocardiogram January 2021 showed ejection fraction 55 to 60%, grade 1 diastolic dysfunction. Lexiscan  nuclear study March 2021 showed ejection fraction 62% and normal perfusion. Patient started on metoprolol  for nonsustained ventricular tachycardia. Since last seen, she denies dyspnea, chest pain, palpitations or syncope.  Current Outpatient Medications  Medication Sig Dispense Refill   Cholecalciferol (VITAMIN D ) 2000 UNITS CAPS Take 2,000 Units by mouth every other day.      clobetasol  ointment (TEMOVATE ) 0.05 % Apply a small amount topically BID for up to 2 weeks prn. Can use the ointment 2 x a week baseline as needed. Not for daily long term use. 60 g 1   Coenzyme Q10 (CO Q 10 PO) Take by mouth every other day.     Cyanocobalamin  (VITAMIN B 12 PO) Take 1 tablet by mouth daily.     denosumab  (PROLIA ) 60 MG/ML SOSY injection 60 mg     Flaxseed, Linseed, (FLAXSEED OIL) 1000 MG CAPS Take by mouth daily.     Grape Seed 50 MG TABS as needed.     magnesium 30 MG tablet Take 30 mg by mouth daily.     metoprolol  tartrate (LOPRESSOR ) 25 MG tablet TAKE (1/2) TABLET TWICE DAILY. 60 tablet 0   Omega-3 Fatty Acids (FISH OIL PO) Take by mouth daily.     polyethylene glycol (MIRALAX ) 17 g packet Take 17 g by mouth daily. 14 each 0   sulfamethoxazole -trimethoprim  (BACTRIM  DS) 800-160 MG tablet Take 1 tablet by mouth 2 (two) times daily. 14 tablet 0   Tragacanth (ASTRAGALUS ROOT) POWD 1     Turmeric 500 MG CAPS Take by mouth.     Valerian Root 100 MG CAPS as needed.     No current facility-administered medications for this visit.     Past Medical History:  Diagnosis Date   Allergic rhinitis    Anxiety    Atrophic vaginitis    Benign head tremor    CKD (chronic kidney disease) stage 3,  GFR 30-59 ml/min (HCC)    Dizziness    Elevated serum glutamic pyruvic transaminase (SGPT) level    History of anxiety    History of rheumatic fever    Hypertension    Lung nodule    Macrocytosis    History of chronic macrocytosis   Melanoma (HCC)    Meralgia paresthetica    History of   NSVT (nonsustained ventricular tachycardia) (HCC)    Osteoporosis    Palpitations    occasional   Post-menopausal    Scarlet fever    Sclerosis of the skin    lichen   Splenic artery aneurysm    Syncope    Tinnitus    Chronic    Past Surgical History:  Procedure Laterality Date   CATARACT EXTRACTION Bilateral    DENTAL SURGERY     SKIN CANCER EXCISION     TONSILLECTOMY      Social History   Socioeconomic History   Marital status: Married    Spouse name: Not on file   Number of children: 5   Years of education: Not on file   Highest education level: Not on file  Occupational History   Occupation: retired  Tobacco Use   Smoking status: Former    Current packs/day: 0.00    Average packs/day: 0.3 packs/day  for 5.0 years (1.3 ttl pk-yrs)    Types: Cigarettes    Start date: 89    Quit date: 1968    Years since quitting: 57.9   Smokeless tobacco: Never   Tobacco comments:    Pt was a some day smoker.  Vaping Use   Vaping status: Never Used  Substance and Sexual Activity   Alcohol use: No    Alcohol/week: 0.0 standard drinks of alcohol   Drug use: No   Sexual activity: Not Currently    Partners: Male    Birth control/protection: Abstinence, Post-menopausal  Other Topics Concern   Not on file  Social History Narrative   Not on file   Social Drivers of Health   Financial Resource Strain: Not on file  Food Insecurity: Not on file  Transportation Needs: Not on file  Physical Activity: Not on file  Stress: Not on file  Social Connections: Not on file  Intimate Partner Violence: Not on file    Family History  Problem Relation Age of Onset   Heart attack Father     Cancer - Colon Mother 44   Hypertension Mother    Heart disease Sister    Cancer Son        muscle    ROS: no fevers or chills, productive cough, hemoptysis, dysphasia, odynophagia, melena, hematochezia, dysuria, hematuria, rash, seizure activity, orthopnea, PND, pedal edema, claudication. Remaining systems are negative.  Physical Exam: Well-developed well-nourished in no acute distress.  Skin is warm and dry.  HEENT is normal.  Neck is supple.  Chest is clear to auscultation with normal expansion.  Cardiovascular exam is regular rate and rhythm.  Abdominal exam nontender or distended. No masses palpated. Extremities show no edema. neuro grossly intact  EKG Interpretation Date/Time:  Tuesday October 22 2024 09:32:12 EST Ventricular Rate:  85 PR Interval:  130 QRS Duration:  70 QT Interval:  338 QTC Calculation: 402 R Axis:   40  Text Interpretation: Normal sinus rhythm Nonspecific ST abnormality Confirmed by Pietro Rogue ([id number]) on 10/22/2024 9:46:16 AM    A/P  1 hypertension-patient's blood pressure is elevated.  I have asked her to follow this at home.  We will add medications if necessary.  Goal systolic blood pressure less than 140 and diastolic less than 90.  2 history of nonsustained ventricular tachycardia-noted on previous monitor.  LV function is normal.  Continue beta-blocker.  Rogue Pietro, MD

## 2024-10-22 ENCOUNTER — Encounter: Payer: Self-pay | Admitting: Cardiology

## 2024-10-22 ENCOUNTER — Ambulatory Visit: Attending: Cardiology | Admitting: Cardiology

## 2024-10-22 VITALS — BP 160/72 | HR 85 | Ht 61.0 in | Wt 112.0 lb

## 2024-10-22 DIAGNOSIS — R002 Palpitations: Secondary | ICD-10-CM

## 2024-10-22 DIAGNOSIS — I4729 Other ventricular tachycardia: Secondary | ICD-10-CM | POA: Diagnosis not present

## 2024-10-22 DIAGNOSIS — I10 Essential (primary) hypertension: Secondary | ICD-10-CM

## 2024-10-22 NOTE — Patient Instructions (Signed)
   Follow-Up: At [facility], you and your health needs are our priority.  As part of our continuing mission to provide you with exceptional heart care, our providers are all part of one team.  This team includes your primary Cardiologist (physician) and Advanced Practice Providers or APPs (Physician Assistants and Nurse Practitioners) who all work together to provide you with the care you need, when you need it.  Your next appointment:   6 month(s)  Provider:   Redell Shallow MD

## 2024-11-28 ENCOUNTER — Ambulatory Visit: Admitting: Podiatry

## 2024-12-09 ENCOUNTER — Ambulatory Visit: Admitting: Podiatry

## 2024-12-26 ENCOUNTER — Ambulatory Visit: Admitting: Podiatry
